# Patient Record
Sex: Female | Born: 1935 | Race: White | Hispanic: No | Smoking: Never smoker
Health system: Southern US, Community
[De-identification: ages and names within clinical notes are randomized; demographics above are authoritative.]

## PROBLEM LIST (undated history)

## (undated) DIAGNOSIS — I480 Paroxysmal atrial fibrillation: Secondary | ICD-10-CM

## (undated) DIAGNOSIS — G8191 Hemiplegia, unspecified affecting right dominant side: Secondary | ICD-10-CM

## (undated) DIAGNOSIS — D509 Iron deficiency anemia, unspecified: Secondary | ICD-10-CM

## (undated) DIAGNOSIS — F329 Major depressive disorder, single episode, unspecified: Secondary | ICD-10-CM

## (undated) DIAGNOSIS — F419 Anxiety disorder, unspecified: Secondary | ICD-10-CM

## (undated) DIAGNOSIS — E785 Hyperlipidemia, unspecified: Secondary | ICD-10-CM

## (undated) DIAGNOSIS — H409 Unspecified glaucoma: Secondary | ICD-10-CM

## (undated) DIAGNOSIS — M129 Arthropathy, unspecified: Secondary | ICD-10-CM

## (undated) DIAGNOSIS — I10 Essential (primary) hypertension: Secondary | ICD-10-CM

## (undated) DIAGNOSIS — I639 Cerebral infarction, unspecified: Secondary | ICD-10-CM

## (undated) DIAGNOSIS — K219 Gastro-esophageal reflux disease without esophagitis: Secondary | ICD-10-CM

## (undated) DIAGNOSIS — R609 Edema, unspecified: Secondary | ICD-10-CM

## (undated) DIAGNOSIS — I872 Venous insufficiency (chronic) (peripheral): Secondary | ICD-10-CM

## (undated) DIAGNOSIS — J3089 Other allergic rhinitis: Secondary | ICD-10-CM

## (undated) DIAGNOSIS — M1990 Unspecified osteoarthritis, unspecified site: Secondary | ICD-10-CM

## (undated) HISTORY — DX: Paroxysmal atrial fibrillation: I48.0

## (undated) HISTORY — DX: Unspecified osteoarthritis, unspecified site: M19.90

## (undated) HISTORY — DX: Gastro-esophageal reflux disease without esophagitis: K21.9

## (undated) HISTORY — DX: Unspecified glaucoma: H40.9

## (undated) HISTORY — DX: Hyperlipidemia, unspecified: E78.5

## (undated) HISTORY — DX: Edema, unspecified: R60.9

## (undated) HISTORY — PX: ABDOMINAL HYSTERECTOMY: SHX81

## (undated) HISTORY — DX: Cerebral infarction, unspecified: I63.9

## (undated) HISTORY — DX: Major depressive disorder, single episode, unspecified: F32.9

## (undated) HISTORY — DX: Arthropathy, unspecified: M12.9

## (undated) HISTORY — DX: Other allergic rhinitis: J30.89

## (undated) HISTORY — DX: Anxiety disorder, unspecified: F41.9

## (undated) HISTORY — DX: Venous insufficiency (chronic) (peripheral): I87.2

## (undated) HISTORY — DX: Essential (primary) hypertension: I10

## (undated) HISTORY — PX: REPLACEMENT TOTAL KNEE: SUR1224

## (undated) HISTORY — DX: Iron deficiency anemia, unspecified: D50.9

---

## 1999-06-11 ENCOUNTER — Ambulatory Visit (HOSPITAL_COMMUNITY): Admission: RE | Admit: 1999-06-11 | Discharge: 1999-06-11 | Payer: Self-pay | Admitting: Obstetrics & Gynecology

## 2008-01-19 ENCOUNTER — Encounter: Payer: Self-pay | Admitting: Internal Medicine

## 2008-01-25 ENCOUNTER — Ambulatory Visit: Payer: Self-pay | Admitting: Physician Assistant

## 2008-02-09 ENCOUNTER — Encounter: Payer: Self-pay | Admitting: Internal Medicine

## 2008-03-14 ENCOUNTER — Encounter: Payer: Self-pay | Admitting: Internal Medicine

## 2008-03-23 ENCOUNTER — Ambulatory Visit: Payer: Self-pay | Admitting: Internal Medicine

## 2008-03-26 ENCOUNTER — Other Ambulatory Visit: Payer: Self-pay

## 2008-03-26 ENCOUNTER — Inpatient Hospital Stay: Payer: Self-pay | Admitting: Internal Medicine

## 2008-04-10 ENCOUNTER — Encounter: Payer: Self-pay | Admitting: Internal Medicine

## 2008-04-20 ENCOUNTER — Ambulatory Visit: Payer: Self-pay | Admitting: Vascular Surgery

## 2008-05-10 ENCOUNTER — Encounter: Payer: Self-pay | Admitting: Internal Medicine

## 2008-06-10 ENCOUNTER — Encounter: Payer: Self-pay | Admitting: Internal Medicine

## 2008-07-11 ENCOUNTER — Encounter: Payer: Self-pay | Admitting: Internal Medicine

## 2008-08-10 ENCOUNTER — Encounter: Payer: Self-pay | Admitting: Internal Medicine

## 2008-09-10 ENCOUNTER — Encounter: Payer: Self-pay | Admitting: Internal Medicine

## 2008-12-13 ENCOUNTER — Encounter: Payer: Self-pay | Admitting: Internal Medicine

## 2009-01-08 ENCOUNTER — Encounter: Payer: Self-pay | Admitting: Internal Medicine

## 2009-03-12 ENCOUNTER — Encounter: Payer: Self-pay | Admitting: Internal Medicine

## 2009-04-10 ENCOUNTER — Encounter: Payer: Self-pay | Admitting: Internal Medicine

## 2009-04-11 ENCOUNTER — Ambulatory Visit: Payer: Self-pay | Admitting: Internal Medicine

## 2009-09-25 ENCOUNTER — Ambulatory Visit: Payer: Self-pay | Admitting: Internal Medicine

## 2010-10-10 ENCOUNTER — Ambulatory Visit: Payer: Self-pay | Admitting: Internal Medicine

## 2010-10-15 ENCOUNTER — Inpatient Hospital Stay: Payer: Self-pay | Admitting: Internal Medicine

## 2010-10-23 ENCOUNTER — Encounter: Payer: Self-pay | Admitting: Internal Medicine

## 2010-10-25 ENCOUNTER — Telehealth: Payer: Self-pay | Admitting: Family Medicine

## 2010-11-10 ENCOUNTER — Ambulatory Visit: Payer: Self-pay | Admitting: Internal Medicine

## 2010-12-02 DIAGNOSIS — F329 Major depressive disorder, single episode, unspecified: Secondary | ICD-10-CM

## 2010-12-02 DIAGNOSIS — I4891 Unspecified atrial fibrillation: Secondary | ICD-10-CM

## 2010-12-02 DIAGNOSIS — I6990 Unspecified sequelae of unspecified cerebrovascular disease: Secondary | ICD-10-CM

## 2010-12-02 DIAGNOSIS — I10 Essential (primary) hypertension: Secondary | ICD-10-CM

## 2010-12-04 DIAGNOSIS — IMO0002 Reserved for concepts with insufficient information to code with codable children: Secondary | ICD-10-CM

## 2010-12-06 DIAGNOSIS — D6489 Other specified anemias: Secondary | ICD-10-CM

## 2010-12-12 NOTE — Progress Notes (Signed)
Summary: needs order to use air pack  Phone Note Call from Patient   Caller: Daughter  Allison Quinn  [phone] Call For: Dr. Alphonsus Sias Summary of Call: Pt was recently admitted to [facility] and will be seeing Dr. Alphonsus Sias there next week.  She has an air pack that she uses at night x 2 years and she will need an order to continue to use it because it is not on her med list there.  Please fax order.   Initial call taken by: Lowella Petties CMA, AAMA,  October 25, 2010 2:28 PM  Follow-up for Phone Call        Do we need more specifics in order to write the order or can it say air pack as needed? Ruthe Mannan MD  October 25, 2010 2:35 PM  Daughter wants order to say ok to use air pack at night while sleeping.              Lowella Petties CMA, AAMA  October 25, 2010 2:39 PM   Additional Follow-up for Phone Call Additional follow up Details #1::        In my box.  Hope thats ok, I have never written an order like that. Ruthe Mannan MD  October 25, 2010 2:40 PM   Order faxed. Additional Follow-up by: Lowella Petties CMA, AAMA,  October 25, 2010 3:08 PM

## 2010-12-12 NOTE — Letter (Signed)
Summary: [facility] Face [facility]  [facility] Face Sheet   Imported By: Beau Fanny 10/30/2010 08:55:12  _____________________________________________________________________  External Attachment:    Type:   Image     Comment:   External Document

## 2010-12-23 DIAGNOSIS — D131 Benign neoplasm of stomach: Secondary | ICD-10-CM

## 2010-12-23 DIAGNOSIS — D649 Anemia, unspecified: Secondary | ICD-10-CM

## 2010-12-24 ENCOUNTER — Encounter: Payer: Self-pay | Admitting: Internal Medicine

## 2011-01-02 DIAGNOSIS — I10 Essential (primary) hypertension: Secondary | ICD-10-CM

## 2011-01-02 DIAGNOSIS — I6990 Unspecified sequelae of unspecified cerebrovascular disease: Secondary | ICD-10-CM

## 2011-01-02 DIAGNOSIS — I4891 Unspecified atrial fibrillation: Secondary | ICD-10-CM

## 2011-01-02 DIAGNOSIS — F329 Major depressive disorder, single episode, unspecified: Secondary | ICD-10-CM

## 2011-01-06 ENCOUNTER — Encounter: Payer: Self-pay | Admitting: Internal Medicine

## 2011-01-06 ENCOUNTER — Ambulatory Visit: Payer: Self-pay | Admitting: Gastroenterology

## 2011-01-13 LAB — PATHOLOGY REPORT

## 2011-01-16 NOTE — Letter (Signed)
Summary: [facility]  [facility]   Imported By: Maryln Gottron 01/08/2011 10:29:03  _____________________________________________________________________  External Attachment:    Type:   Image     Comment:   External Document

## 2011-01-17 DIAGNOSIS — L02419 Cutaneous abscess of limb, unspecified: Secondary | ICD-10-CM

## 2011-01-17 DIAGNOSIS — D500 Iron deficiency anemia secondary to blood loss (chronic): Secondary | ICD-10-CM

## 2011-01-17 DIAGNOSIS — L03119 Cellulitis of unspecified part of limb: Secondary | ICD-10-CM

## 2011-01-17 DIAGNOSIS — I4891 Unspecified atrial fibrillation: Secondary | ICD-10-CM

## 2011-01-17 DIAGNOSIS — R609 Edema, unspecified: Secondary | ICD-10-CM

## 2011-01-21 NOTE — Procedures (Signed)
Summary: Colonoscopy/[facility]  Colonoscopy/[facility]   Imported By: Lanelle Bal 01/13/2011 11:30:45  _____________________________________________________________________  External Attachment:    Type:   Image     Comment:   External Document  Appended Document: Colonoscopy/ Regional circumferential rectal ulcers---?cancer

## 2011-01-21 NOTE — Procedures (Signed)
Summary: Upper GI Endoscopy/[facility]  Upper GI Endoscopy/[facility]   Imported By: Lanelle Bal 01/13/2011 11:28:35  _____________________________________________________________________  External Attachment:    Type:   Image     Comment:   External Document  Appended Document: Upper GI Endoscopy/[facility] no bleeding source

## 2011-02-03 DIAGNOSIS — L989 Disorder of the skin and subcutaneous tissue, unspecified: Secondary | ICD-10-CM

## 2011-02-13 ENCOUNTER — Encounter: Payer: Self-pay | Admitting: Internal Medicine

## 2011-02-23 ENCOUNTER — Telehealth: Payer: Self-pay | Admitting: Family Medicine

## 2011-02-23 NOTE — Telephone Encounter (Signed)
Received call this morning from RN at [facility] about change in status of patient.  Newly noted today swelling of L knee, warm and tight.  Painful with movement.  Pt is bed-bound. H/o DVT but no calf swelling/pain.  No fevers. Did request tramadol last night, but not endorsing acute pain.  Has tramadol and tylenol standing order. Not currently on coumadin, just on ASA. Reviewed meds. ? OA.  Doubt septic joint as no systemic sxs.  No h/o gout.  Recommend ice to knee, elevate leg, update if any fevers or worsening. Will route to Dr. Allen Norris to be aware.

## 2011-02-24 NOTE — Telephone Encounter (Signed)
Better today Reviewed at [facility]

## 2011-02-25 DIAGNOSIS — M7989 Other specified soft tissue disorders: Secondary | ICD-10-CM

## 2011-03-05 DIAGNOSIS — L02818 Cutaneous abscess of other sites: Secondary | ICD-10-CM

## 2011-03-05 DIAGNOSIS — L03818 Cellulitis of other sites: Secondary | ICD-10-CM

## 2011-04-03 DIAGNOSIS — I4891 Unspecified atrial fibrillation: Secondary | ICD-10-CM

## 2011-04-03 DIAGNOSIS — I6990 Unspecified sequelae of unspecified cerebrovascular disease: Secondary | ICD-10-CM

## 2011-04-03 DIAGNOSIS — F329 Major depressive disorder, single episode, unspecified: Secondary | ICD-10-CM

## 2011-04-03 DIAGNOSIS — I10 Essential (primary) hypertension: Secondary | ICD-10-CM

## 2011-04-29 ENCOUNTER — Ambulatory Visit: Payer: Self-pay | Admitting: Gastroenterology

## 2011-05-01 DIAGNOSIS — I10 Essential (primary) hypertension: Secondary | ICD-10-CM

## 2011-05-01 DIAGNOSIS — F329 Major depressive disorder, single episode, unspecified: Secondary | ICD-10-CM

## 2011-05-01 DIAGNOSIS — I6990 Unspecified sequelae of unspecified cerebrovascular disease: Secondary | ICD-10-CM

## 2011-05-01 DIAGNOSIS — I4891 Unspecified atrial fibrillation: Secondary | ICD-10-CM

## 2011-05-01 LAB — PATHOLOGY REPORT

## 2011-06-03 DIAGNOSIS — M7580 Other shoulder lesions, unspecified shoulder: Secondary | ICD-10-CM

## 2011-06-17 DIAGNOSIS — M75100 Unspecified rotator cuff tear or rupture of unspecified shoulder, not specified as traumatic: Secondary | ICD-10-CM

## 2011-07-02 DIAGNOSIS — T7840XA Allergy, unspecified, initial encounter: Secondary | ICD-10-CM

## 2011-07-15 DIAGNOSIS — M25519 Pain in unspecified shoulder: Secondary | ICD-10-CM

## 2011-08-15 DIAGNOSIS — L03039 Cellulitis of unspecified toe: Secondary | ICD-10-CM

## 2011-08-15 DIAGNOSIS — L02619 Cutaneous abscess of unspecified foot: Secondary | ICD-10-CM

## 2011-08-26 DIAGNOSIS — M19219 Secondary osteoarthritis, unspecified shoulder: Secondary | ICD-10-CM

## 2011-09-18 DIAGNOSIS — L570 Actinic keratosis: Secondary | ICD-10-CM

## 2011-10-07 ENCOUNTER — Telehealth: Payer: Self-pay | Admitting: *Deleted

## 2011-10-07 NOTE — Telephone Encounter (Signed)
Prior auth given for voltaren gel, approval letter is on your desk.

## 2011-10-08 NOTE — Telephone Encounter (Signed)
noted 

## 2011-10-30 DIAGNOSIS — I6990 Unspecified sequelae of unspecified cerebrovascular disease: Secondary | ICD-10-CM

## 2011-10-30 DIAGNOSIS — I4891 Unspecified atrial fibrillation: Secondary | ICD-10-CM

## 2011-10-30 DIAGNOSIS — I10 Essential (primary) hypertension: Secondary | ICD-10-CM

## 2011-10-30 DIAGNOSIS — F329 Major depressive disorder, single episode, unspecified: Secondary | ICD-10-CM

## 2011-12-09 DIAGNOSIS — M1710 Unilateral primary osteoarthritis, unspecified knee: Secondary | ICD-10-CM

## 2011-12-30 DIAGNOSIS — F329 Major depressive disorder, single episode, unspecified: Secondary | ICD-10-CM

## 2011-12-30 DIAGNOSIS — I6990 Unspecified sequelae of unspecified cerebrovascular disease: Secondary | ICD-10-CM

## 2011-12-30 DIAGNOSIS — I4891 Unspecified atrial fibrillation: Secondary | ICD-10-CM

## 2011-12-30 DIAGNOSIS — I10 Essential (primary) hypertension: Secondary | ICD-10-CM

## 2012-01-27 DIAGNOSIS — F39 Unspecified mood [affective] disorder: Secondary | ICD-10-CM

## 2012-02-17 DIAGNOSIS — I6990 Unspecified sequelae of unspecified cerebrovascular disease: Secondary | ICD-10-CM

## 2012-02-17 DIAGNOSIS — I4891 Unspecified atrial fibrillation: Secondary | ICD-10-CM

## 2012-02-17 DIAGNOSIS — F329 Major depressive disorder, single episode, unspecified: Secondary | ICD-10-CM

## 2012-02-17 DIAGNOSIS — I872 Venous insufficiency (chronic) (peripheral): Secondary | ICD-10-CM

## 2012-03-23 DIAGNOSIS — C44711 Basal cell carcinoma of skin of unspecified lower limb, including hip: Secondary | ICD-10-CM

## 2012-04-19 DIAGNOSIS — R070 Pain in throat: Secondary | ICD-10-CM

## 2012-05-11 DIAGNOSIS — M1710 Unilateral primary osteoarthritis, unspecified knee: Secondary | ICD-10-CM

## 2012-07-01 DIAGNOSIS — H9209 Otalgia, unspecified ear: Secondary | ICD-10-CM

## 2012-07-06 DIAGNOSIS — R635 Abnormal weight gain: Secondary | ICD-10-CM

## 2012-07-06 DIAGNOSIS — M19019 Primary osteoarthritis, unspecified shoulder: Secondary | ICD-10-CM

## 2012-08-13 DIAGNOSIS — IMO0002 Reserved for concepts with insufficient information to code with codable children: Secondary | ICD-10-CM

## 2012-09-06 DIAGNOSIS — I4891 Unspecified atrial fibrillation: Secondary | ICD-10-CM

## 2012-09-06 DIAGNOSIS — F329 Major depressive disorder, single episode, unspecified: Secondary | ICD-10-CM

## 2012-09-06 DIAGNOSIS — I10 Essential (primary) hypertension: Secondary | ICD-10-CM

## 2012-09-06 DIAGNOSIS — I6990 Unspecified sequelae of unspecified cerebrovascular disease: Secondary | ICD-10-CM

## 2012-09-14 DIAGNOSIS — I872 Venous insufficiency (chronic) (peripheral): Secondary | ICD-10-CM

## 2012-09-14 DIAGNOSIS — F411 Generalized anxiety disorder: Secondary | ICD-10-CM

## 2012-10-11 DIAGNOSIS — IMO0002 Reserved for concepts with insufficient information to code with codable children: Secondary | ICD-10-CM

## 2012-10-19 DIAGNOSIS — IMO0002 Reserved for concepts with insufficient information to code with codable children: Secondary | ICD-10-CM

## 2012-11-23 DIAGNOSIS — M25569 Pain in unspecified knee: Secondary | ICD-10-CM

## 2012-12-06 DIAGNOSIS — F329 Major depressive disorder, single episode, unspecified: Secondary | ICD-10-CM

## 2012-12-06 DIAGNOSIS — F3289 Other specified depressive episodes: Secondary | ICD-10-CM

## 2012-12-13 DIAGNOSIS — I6990 Unspecified sequelae of unspecified cerebrovascular disease: Secondary | ICD-10-CM

## 2012-12-13 DIAGNOSIS — I872 Venous insufficiency (chronic) (peripheral): Secondary | ICD-10-CM

## 2012-12-13 DIAGNOSIS — I10 Essential (primary) hypertension: Secondary | ICD-10-CM

## 2012-12-13 DIAGNOSIS — F329 Major depressive disorder, single episode, unspecified: Secondary | ICD-10-CM

## 2012-12-23 DIAGNOSIS — N6459 Other signs and symptoms in breast: Secondary | ICD-10-CM

## 2012-12-30 DIAGNOSIS — M19019 Primary osteoarthritis, unspecified shoulder: Secondary | ICD-10-CM

## 2013-02-01 DIAGNOSIS — J069 Acute upper respiratory infection, unspecified: Secondary | ICD-10-CM

## 2013-03-03 DIAGNOSIS — F329 Major depressive disorder, single episode, unspecified: Secondary | ICD-10-CM

## 2013-03-03 DIAGNOSIS — I10 Essential (primary) hypertension: Secondary | ICD-10-CM

## 2013-03-03 DIAGNOSIS — I6990 Unspecified sequelae of unspecified cerebrovascular disease: Secondary | ICD-10-CM

## 2013-03-03 DIAGNOSIS — I87019 Postthrombotic syndrome with ulcer of unspecified lower extremity: Secondary | ICD-10-CM

## 2013-03-04 DIAGNOSIS — L989 Disorder of the skin and subcutaneous tissue, unspecified: Secondary | ICD-10-CM

## 2013-04-15 DIAGNOSIS — B351 Tinea unguium: Secondary | ICD-10-CM

## 2013-05-05 DIAGNOSIS — L570 Actinic keratosis: Secondary | ICD-10-CM

## 2013-07-04 DIAGNOSIS — F329 Major depressive disorder, single episode, unspecified: Secondary | ICD-10-CM

## 2013-07-04 DIAGNOSIS — I872 Venous insufficiency (chronic) (peripheral): Secondary | ICD-10-CM

## 2013-07-04 DIAGNOSIS — I6990 Unspecified sequelae of unspecified cerebrovascular disease: Secondary | ICD-10-CM

## 2013-07-04 DIAGNOSIS — I10 Essential (primary) hypertension: Secondary | ICD-10-CM

## 2013-07-26 DIAGNOSIS — M159 Polyosteoarthritis, unspecified: Secondary | ICD-10-CM

## 2013-08-24 DIAGNOSIS — F411 Generalized anxiety disorder: Secondary | ICD-10-CM

## 2013-08-24 DIAGNOSIS — M1710 Unilateral primary osteoarthritis, unspecified knee: Secondary | ICD-10-CM

## 2013-08-24 DIAGNOSIS — I10 Essential (primary) hypertension: Secondary | ICD-10-CM

## 2013-08-24 DIAGNOSIS — I69998 Other sequelae following unspecified cerebrovascular disease: Secondary | ICD-10-CM

## 2013-08-24 DIAGNOSIS — F329 Major depressive disorder, single episode, unspecified: Secondary | ICD-10-CM

## 2013-08-24 DIAGNOSIS — I872 Venous insufficiency (chronic) (peripheral): Secondary | ICD-10-CM

## 2013-10-11 DIAGNOSIS — J209 Acute bronchitis, unspecified: Secondary | ICD-10-CM

## 2014-02-14 DIAGNOSIS — F3289 Other specified depressive episodes: Secondary | ICD-10-CM

## 2014-02-14 DIAGNOSIS — M1710 Unilateral primary osteoarthritis, unspecified knee: Secondary | ICD-10-CM

## 2014-02-14 DIAGNOSIS — IMO0002 Reserved for concepts with insufficient information to code with codable children: Secondary | ICD-10-CM

## 2014-02-14 DIAGNOSIS — F329 Major depressive disorder, single episode, unspecified: Secondary | ICD-10-CM

## 2014-02-14 DIAGNOSIS — I10 Essential (primary) hypertension: Secondary | ICD-10-CM

## 2014-02-14 DIAGNOSIS — J209 Acute bronchitis, unspecified: Secondary | ICD-10-CM

## 2014-02-14 DIAGNOSIS — I6350 Cerebral infarction due to unspecified occlusion or stenosis of unspecified cerebral artery: Secondary | ICD-10-CM

## 2014-02-16 DIAGNOSIS — S90129A Contusion of unspecified lesser toe(s) without damage to nail, initial encounter: Secondary | ICD-10-CM

## 2014-02-28 DIAGNOSIS — J309 Allergic rhinitis, unspecified: Secondary | ICD-10-CM

## 2014-02-28 DIAGNOSIS — J019 Acute sinusitis, unspecified: Secondary | ICD-10-CM

## 2014-04-04 DIAGNOSIS — M79609 Pain in unspecified limb: Secondary | ICD-10-CM

## 2014-04-19 DIAGNOSIS — F329 Major depressive disorder, single episode, unspecified: Secondary | ICD-10-CM

## 2014-04-19 DIAGNOSIS — I6350 Cerebral infarction due to unspecified occlusion or stenosis of unspecified cerebral artery: Secondary | ICD-10-CM

## 2014-04-19 DIAGNOSIS — IMO0002 Reserved for concepts with insufficient information to code with codable children: Secondary | ICD-10-CM

## 2014-04-19 DIAGNOSIS — I872 Venous insufficiency (chronic) (peripheral): Secondary | ICD-10-CM

## 2014-04-19 DIAGNOSIS — F3289 Other specified depressive episodes: Secondary | ICD-10-CM

## 2014-04-19 DIAGNOSIS — I10 Essential (primary) hypertension: Secondary | ICD-10-CM

## 2014-04-19 DIAGNOSIS — M1710 Unilateral primary osteoarthritis, unspecified knee: Secondary | ICD-10-CM

## 2014-04-24 DIAGNOSIS — B308 Other viral conjunctivitis: Secondary | ICD-10-CM

## 2014-06-29 DIAGNOSIS — I10 Essential (primary) hypertension: Secondary | ICD-10-CM

## 2014-06-29 DIAGNOSIS — F329 Major depressive disorder, single episode, unspecified: Secondary | ICD-10-CM

## 2014-06-29 DIAGNOSIS — I4891 Unspecified atrial fibrillation: Secondary | ICD-10-CM

## 2014-06-29 DIAGNOSIS — I872 Venous insufficiency (chronic) (peripheral): Secondary | ICD-10-CM

## 2014-06-29 DIAGNOSIS — I69959 Hemiplegia and hemiparesis following unspecified cerebrovascular disease affecting unspecified side: Secondary | ICD-10-CM

## 2014-06-29 DIAGNOSIS — F3289 Other specified depressive episodes: Secondary | ICD-10-CM

## 2014-07-20 DIAGNOSIS — J309 Allergic rhinitis, unspecified: Secondary | ICD-10-CM

## 2014-08-16 DIAGNOSIS — I4891 Unspecified atrial fibrillation: Secondary | ICD-10-CM

## 2014-08-16 DIAGNOSIS — K219 Gastro-esophageal reflux disease without esophagitis: Secondary | ICD-10-CM

## 2014-08-16 DIAGNOSIS — I872 Venous insufficiency (chronic) (peripheral): Secondary | ICD-10-CM

## 2014-08-16 DIAGNOSIS — F323 Major depressive disorder, single episode, severe with psychotic features: Secondary | ICD-10-CM

## 2014-08-16 DIAGNOSIS — I69398 Other sequelae of cerebral infarction: Secondary | ICD-10-CM

## 2014-08-16 DIAGNOSIS — J301 Allergic rhinitis due to pollen: Secondary | ICD-10-CM

## 2014-08-16 DIAGNOSIS — I10 Essential (primary) hypertension: Secondary | ICD-10-CM

## 2014-10-19 DIAGNOSIS — I10 Essential (primary) hypertension: Secondary | ICD-10-CM

## 2014-10-19 DIAGNOSIS — K219 Gastro-esophageal reflux disease without esophagitis: Secondary | ICD-10-CM

## 2014-10-19 DIAGNOSIS — G8191 Hemiplegia, unspecified affecting right dominant side: Secondary | ICD-10-CM

## 2014-10-19 DIAGNOSIS — I872 Venous insufficiency (chronic) (peripheral): Secondary | ICD-10-CM

## 2014-10-19 DIAGNOSIS — I480 Paroxysmal atrial fibrillation: Secondary | ICD-10-CM

## 2014-10-19 DIAGNOSIS — F39 Unspecified mood [affective] disorder: Secondary | ICD-10-CM

## 2014-11-07 DIAGNOSIS — J069 Acute upper respiratory infection, unspecified: Secondary | ICD-10-CM

## 2014-12-14 DIAGNOSIS — I480 Paroxysmal atrial fibrillation: Secondary | ICD-10-CM

## 2014-12-14 DIAGNOSIS — K219 Gastro-esophageal reflux disease without esophagitis: Secondary | ICD-10-CM

## 2014-12-14 DIAGNOSIS — F39 Unspecified mood [affective] disorder: Secondary | ICD-10-CM

## 2014-12-14 DIAGNOSIS — G8191 Hemiplegia, unspecified affecting right dominant side: Secondary | ICD-10-CM

## 2014-12-14 DIAGNOSIS — I872 Venous insufficiency (chronic) (peripheral): Secondary | ICD-10-CM

## 2014-12-14 DIAGNOSIS — I10 Essential (primary) hypertension: Secondary | ICD-10-CM

## 2015-01-05 DIAGNOSIS — R221 Localized swelling, mass and lump, neck: Secondary | ICD-10-CM | POA: Diagnosis not present

## 2015-02-21 DIAGNOSIS — I10 Essential (primary) hypertension: Secondary | ICD-10-CM | POA: Diagnosis not present

## 2015-02-21 DIAGNOSIS — F323 Major depressive disorder, single episode, severe with psychotic features: Secondary | ICD-10-CM

## 2015-02-21 DIAGNOSIS — I69398 Other sequelae of cerebral infarction: Secondary | ICD-10-CM | POA: Diagnosis not present

## 2015-02-21 DIAGNOSIS — J301 Allergic rhinitis due to pollen: Secondary | ICD-10-CM

## 2015-02-21 DIAGNOSIS — K219 Gastro-esophageal reflux disease without esophagitis: Secondary | ICD-10-CM | POA: Diagnosis not present

## 2015-02-21 DIAGNOSIS — I872 Venous insufficiency (chronic) (peripheral): Secondary | ICD-10-CM

## 2015-02-21 DIAGNOSIS — I4891 Unspecified atrial fibrillation: Secondary | ICD-10-CM | POA: Diagnosis not present

## 2015-04-19 DIAGNOSIS — I480 Paroxysmal atrial fibrillation: Secondary | ICD-10-CM | POA: Diagnosis not present

## 2015-04-19 DIAGNOSIS — L570 Actinic keratosis: Secondary | ICD-10-CM | POA: Diagnosis not present

## 2015-04-19 DIAGNOSIS — K219 Gastro-esophageal reflux disease without esophagitis: Secondary | ICD-10-CM | POA: Diagnosis not present

## 2015-04-19 DIAGNOSIS — I872 Venous insufficiency (chronic) (peripheral): Secondary | ICD-10-CM | POA: Diagnosis not present

## 2015-05-25 DIAGNOSIS — B351 Tinea unguium: Secondary | ICD-10-CM | POA: Diagnosis not present

## 2015-06-22 DIAGNOSIS — M1990 Unspecified osteoarthritis, unspecified site: Secondary | ICD-10-CM

## 2015-06-22 DIAGNOSIS — F39 Unspecified mood [affective] disorder: Secondary | ICD-10-CM

## 2015-06-22 DIAGNOSIS — I872 Venous insufficiency (chronic) (peripheral): Secondary | ICD-10-CM

## 2015-06-22 DIAGNOSIS — I69398 Other sequelae of cerebral infarction: Secondary | ICD-10-CM | POA: Diagnosis not present

## 2015-06-22 DIAGNOSIS — I4891 Unspecified atrial fibrillation: Secondary | ICD-10-CM | POA: Diagnosis not present

## 2015-06-22 DIAGNOSIS — I10 Essential (primary) hypertension: Secondary | ICD-10-CM | POA: Diagnosis not present

## 2015-06-22 DIAGNOSIS — K219 Gastro-esophageal reflux disease without esophagitis: Secondary | ICD-10-CM | POA: Diagnosis not present

## 2015-10-24 DIAGNOSIS — M1990 Unspecified osteoarthritis, unspecified site: Secondary | ICD-10-CM | POA: Diagnosis not present

## 2015-10-24 DIAGNOSIS — K219 Gastro-esophageal reflux disease without esophagitis: Secondary | ICD-10-CM | POA: Diagnosis not present

## 2015-10-24 DIAGNOSIS — F39 Unspecified mood [affective] disorder: Secondary | ICD-10-CM

## 2015-10-24 DIAGNOSIS — I10 Essential (primary) hypertension: Secondary | ICD-10-CM | POA: Diagnosis not present

## 2015-10-24 DIAGNOSIS — I872 Venous insufficiency (chronic) (peripheral): Secondary | ICD-10-CM

## 2015-10-24 DIAGNOSIS — I69398 Other sequelae of cerebral infarction: Secondary | ICD-10-CM

## 2015-10-24 DIAGNOSIS — I4891 Unspecified atrial fibrillation: Secondary | ICD-10-CM | POA: Diagnosis not present

## 2015-11-23 DIAGNOSIS — L989 Disorder of the skin and subcutaneous tissue, unspecified: Secondary | ICD-10-CM | POA: Diagnosis not present

## 2015-12-20 DIAGNOSIS — I480 Paroxysmal atrial fibrillation: Secondary | ICD-10-CM | POA: Diagnosis not present

## 2015-12-20 DIAGNOSIS — I872 Venous insufficiency (chronic) (peripheral): Secondary | ICD-10-CM | POA: Diagnosis not present

## 2015-12-20 DIAGNOSIS — K219 Gastro-esophageal reflux disease without esophagitis: Secondary | ICD-10-CM | POA: Diagnosis not present

## 2015-12-20 DIAGNOSIS — F39 Unspecified mood [affective] disorder: Secondary | ICD-10-CM

## 2015-12-20 DIAGNOSIS — G8191 Hemiplegia, unspecified affecting right dominant side: Secondary | ICD-10-CM | POA: Diagnosis not present

## 2016-02-20 DIAGNOSIS — M1990 Unspecified osteoarthritis, unspecified site: Secondary | ICD-10-CM

## 2016-02-20 DIAGNOSIS — I10 Essential (primary) hypertension: Secondary | ICD-10-CM | POA: Diagnosis not present

## 2016-02-20 DIAGNOSIS — I4891 Unspecified atrial fibrillation: Secondary | ICD-10-CM | POA: Diagnosis not present

## 2016-02-20 DIAGNOSIS — I69398 Other sequelae of cerebral infarction: Secondary | ICD-10-CM | POA: Diagnosis not present

## 2016-02-20 DIAGNOSIS — K219 Gastro-esophageal reflux disease without esophagitis: Secondary | ICD-10-CM | POA: Diagnosis not present

## 2016-03-03 DIAGNOSIS — R110 Nausea: Secondary | ICD-10-CM | POA: Diagnosis not present

## 2016-03-12 DIAGNOSIS — R05 Cough: Secondary | ICD-10-CM | POA: Diagnosis not present

## 2016-03-12 DIAGNOSIS — R4182 Altered mental status, unspecified: Secondary | ICD-10-CM | POA: Diagnosis not present

## 2016-03-13 DIAGNOSIS — J181 Lobar pneumonia, unspecified organism: Secondary | ICD-10-CM | POA: Diagnosis not present

## 2016-04-24 DIAGNOSIS — I480 Paroxysmal atrial fibrillation: Secondary | ICD-10-CM | POA: Diagnosis not present

## 2016-04-24 DIAGNOSIS — K219 Gastro-esophageal reflux disease without esophagitis: Secondary | ICD-10-CM | POA: Diagnosis not present

## 2016-04-24 DIAGNOSIS — F39 Unspecified mood [affective] disorder: Secondary | ICD-10-CM

## 2016-04-24 DIAGNOSIS — G8191 Hemiplegia, unspecified affecting right dominant side: Secondary | ICD-10-CM | POA: Diagnosis not present

## 2016-04-24 DIAGNOSIS — I872 Venous insufficiency (chronic) (peripheral): Secondary | ICD-10-CM | POA: Diagnosis not present

## 2016-05-21 DIAGNOSIS — B351 Tinea unguium: Secondary | ICD-10-CM

## 2016-06-18 DIAGNOSIS — F39 Unspecified mood [affective] disorder: Secondary | ICD-10-CM

## 2016-06-18 DIAGNOSIS — K219 Gastro-esophageal reflux disease without esophagitis: Secondary | ICD-10-CM | POA: Diagnosis not present

## 2016-06-18 DIAGNOSIS — M1990 Unspecified osteoarthritis, unspecified site: Secondary | ICD-10-CM

## 2016-06-18 DIAGNOSIS — I4891 Unspecified atrial fibrillation: Secondary | ICD-10-CM | POA: Diagnosis not present

## 2016-06-18 DIAGNOSIS — I82210 Acute embolism and thrombosis of superior vena cava: Secondary | ICD-10-CM | POA: Diagnosis not present

## 2016-06-18 DIAGNOSIS — I69398 Other sequelae of cerebral infarction: Secondary | ICD-10-CM | POA: Diagnosis not present

## 2016-06-18 DIAGNOSIS — I10 Essential (primary) hypertension: Secondary | ICD-10-CM

## 2016-08-21 DIAGNOSIS — K219 Gastro-esophageal reflux disease without esophagitis: Secondary | ICD-10-CM

## 2016-08-21 DIAGNOSIS — I10 Essential (primary) hypertension: Secondary | ICD-10-CM | POA: Diagnosis not present

## 2016-08-21 DIAGNOSIS — I872 Venous insufficiency (chronic) (peripheral): Secondary | ICD-10-CM | POA: Diagnosis not present

## 2016-08-21 DIAGNOSIS — I480 Paroxysmal atrial fibrillation: Secondary | ICD-10-CM | POA: Diagnosis not present

## 2016-08-21 DIAGNOSIS — F39 Unspecified mood [affective] disorder: Secondary | ICD-10-CM

## 2016-08-21 DIAGNOSIS — G8191 Hemiplegia, unspecified affecting right dominant side: Secondary | ICD-10-CM | POA: Diagnosis not present

## 2016-09-03 DIAGNOSIS — B351 Tinea unguium: Secondary | ICD-10-CM

## 2016-10-13 DIAGNOSIS — L03039 Cellulitis of unspecified toe: Secondary | ICD-10-CM

## 2016-10-24 DIAGNOSIS — I69398 Other sequelae of cerebral infarction: Secondary | ICD-10-CM | POA: Diagnosis not present

## 2016-10-24 DIAGNOSIS — K219 Gastro-esophageal reflux disease without esophagitis: Secondary | ICD-10-CM

## 2016-10-24 DIAGNOSIS — I4891 Unspecified atrial fibrillation: Secondary | ICD-10-CM | POA: Diagnosis not present

## 2016-10-24 DIAGNOSIS — R062 Wheezing: Secondary | ICD-10-CM

## 2016-10-24 DIAGNOSIS — I872 Venous insufficiency (chronic) (peripheral): Secondary | ICD-10-CM | POA: Diagnosis not present

## 2016-10-24 DIAGNOSIS — I10 Essential (primary) hypertension: Secondary | ICD-10-CM

## 2016-10-24 DIAGNOSIS — F0631 Mood disorder due to known physiological condition with depressive features: Secondary | ICD-10-CM

## 2016-10-24 DIAGNOSIS — M1990 Unspecified osteoarthritis, unspecified site: Secondary | ICD-10-CM | POA: Diagnosis not present

## 2016-12-08 ENCOUNTER — Telehealth: Payer: Self-pay | Admitting: Internal Medicine

## 2016-12-08 DIAGNOSIS — H10023 Other mucopurulent conjunctivitis, bilateral: Secondary | ICD-10-CM | POA: Diagnosis not present

## 2016-12-08 NOTE — Telephone Encounter (Signed)
error 

## 2017-01-01 DIAGNOSIS — I480 Paroxysmal atrial fibrillation: Secondary | ICD-10-CM | POA: Diagnosis not present

## 2017-01-01 DIAGNOSIS — F39 Unspecified mood [affective] disorder: Secondary | ICD-10-CM

## 2017-01-01 DIAGNOSIS — K219 Gastro-esophageal reflux disease without esophagitis: Secondary | ICD-10-CM | POA: Diagnosis not present

## 2017-01-01 DIAGNOSIS — I10 Essential (primary) hypertension: Secondary | ICD-10-CM | POA: Diagnosis not present

## 2017-01-01 DIAGNOSIS — G8191 Hemiplegia, unspecified affecting right dominant side: Secondary | ICD-10-CM | POA: Diagnosis not present

## 2017-02-27 DIAGNOSIS — I4891 Unspecified atrial fibrillation: Secondary | ICD-10-CM | POA: Diagnosis not present

## 2017-02-27 DIAGNOSIS — I69398 Other sequelae of cerebral infarction: Secondary | ICD-10-CM | POA: Diagnosis not present

## 2017-03-04 DIAGNOSIS — B351 Tinea unguium: Secondary | ICD-10-CM

## 2017-03-09 DIAGNOSIS — M1990 Unspecified osteoarthritis, unspecified site: Secondary | ICD-10-CM

## 2017-03-09 DIAGNOSIS — M25522 Pain in left elbow: Secondary | ICD-10-CM

## 2017-03-10 DIAGNOSIS — M25512 Pain in left shoulder: Secondary | ICD-10-CM

## 2017-03-12 DIAGNOSIS — C7951 Secondary malignant neoplasm of bone: Secondary | ICD-10-CM

## 2017-03-22 DIAGNOSIS — C7951 Secondary malignant neoplasm of bone: Secondary | ICD-10-CM | POA: Insufficient documentation

## 2017-03-22 NOTE — Progress Notes (Addendum)
[facility]  [phone] [phone]  ID: Allison Quinn OB: 1926-03-31  MR#: [id number]  BPZ#:[id number]  Patient Care Team: Venia Carbon, MD as PCP - General  CHIEF COMPLAINT: Bone metastasis  INTERVAL HISTORY: Patient is an 83 year old female who is a resident of nursing home who presented with a fractured left humerus. Subsequent workup indicated fracture was likely pathologic. Patient is hard of hearing and demented and much the history is given by her son. He does not report any falls. She has no new neurologic complaints. She has no fevers or recent illnesses. She has not complained of chest pain or shortness of breath. She has a fair appetite and there is no report of weight loss. She has no nausea, vomiting, constipation, or diarrhea. She has no urinary complaints. Patient appears to be at her baseline and offers no specific complaints today.  REVIEW OF SYSTEMS:   Review of Systems  Unable to perform ROS: Dementia    As per HPI. Otherwise, a complete review of systems is negative.  PAST MEDICAL HISTORY: Past Medical History:  Diagnosis Date  . Anxiety disorder, unspecified   . Arthritis   . Arthropathy   . Edema   . Essential hypertension   . GERD (gastroesophageal reflux disease)   . Glaucoma   . Hyperlipidemia   . IDA (iron deficiency anemia)   . Major depressive disorder   . Other allergic rhinitis   . Paroxysmal atrial fibrillation (HCC)   . Stroke ([place])   . Venous insufficiency (chronic) (peripheral)     PAST SURGICAL HISTORY: Past Surgical History:  Procedure Laterality Date  . ABDOMINAL HYSTERECTOMY      FAMILY HISTORY: No family history on file.  ADVANCED DIRECTIVES (Y/N):  N  HEALTH MAINTENANCE: Social History  Substance Use Topics  . Smoking status: Not on file  . Smokeless tobacco: Not on file  . Alcohol use Not on file     Colonoscopy:  PAP:  Bone density:  Lipid panel:  Allergies    Allergen Reactions  . Codeine   . Lincocin [Lincomycin Hcl]   . Penicillins   . Tetracyclines & Related     Current Outpatient Prescriptions  Medication Sig Dispense Refill  . acetaminophen (TYLENOL) 325 MG tablet Take 650 mg by mouth 4 (four) times daily as needed.    Marland Kitchen alum & mag hydroxide-simeth (MAALOX/MYLANTA) 200-200-20 MG/5ML suspension Take 30 mLs by mouth every 4 (four) hours as needed for indigestion or heartburn.    Marland Kitchen aspirin 81 MG chewable tablet Chew 81 mg by mouth daily.    . carbamide peroxide (DEBROX) 6.5 % otic solution Place 5 drops into both ears as needed.    . carvedilol (COREG) 3.125 MG tablet Take 3.125 mg by mouth 2 (two) times daily with a meal.    . clindamycin (CLEOCIN) 150 MG capsule Take by mouth once. Take 4 capsules PO 1 hour prior to dental procedure    . diclofenac sodium (VOLTAREN) 1 % GEL Apply 2 g topically 2 (two) times daily.    . Emollient (EUCERIN) lotion Apply topically as needed for dry skin.    Marland Kitchen ergocalciferol (VITAMIN D2) 50000 units capsule Take 50,000 Units by mouth every 30 (thirty) days.    . fexofenadine (ALLEGRA) 180 MG tablet Take 180 mg by mouth daily as needed for allergies or rhinitis.    Marland Kitchen FLUoxetine (PROZAC) 20 MG capsule Take 20 mg by mouth daily.    . fluticasone (FLONASE) 50  MCG/ACT nasal spray Place 2 sprays into both nostrils daily.    . furosemide (LASIX) 80 MG tablet Take 80 mg by mouth as needed. If weight is greater than 200, give at 1pm    . furosemide (LASIX) 80 MG tablet Take 80 mg by mouth daily.    Marland Kitchen guaifenesin (ROBITUSSIN) 100 MG/5ML syrup Take 200 mg by mouth every 4 (four) hours as needed for cough.    Marland Kitchen HYDROcodone-acetaminophen (NORCO/VICODIN) 5-325 MG tablet Take 1 tablet by mouth 2 (two) times daily.    . hydroxypropyl methylcellulose / hypromellose (ISOPTO TEARS / GONIOVISC) 2.5 % ophthalmic solution Place 1 drop into both eyes at bedtime.    Marland Kitchen ipratropium-albuterol (DUONEB) 0.5-2.5 (3) MG/3ML SOLN Take 3 mLs  by nebulization every 4 (four) hours as needed.    . loratadine (CLARITIN) 10 MG tablet Take 10 mg by mouth at bedtime as needed for allergies.    Marland Kitchen loratadine (CLARITIN) 10 MG tablet Take 10 mg by mouth daily.    Marland Kitchen LORazepam (ATIVAN) 0.5 MG tablet Take 0.25 mg by mouth 2 (two) times daily.    Marland Kitchen losartan (COZAAR) 25 MG tablet Take 25 mg by mouth daily.    . montelukast (SINGULAIR) 10 MG tablet Take 10 mg by mouth at bedtime.    . Multiple Vitamin (MULTIVITAMIN) tablet Take 1 tablet by mouth daily.    . pantoprazole (PROTONIX) 40 MG tablet Take 40 mg by mouth daily.    . sennosides-docusate sodium (SENOKOT-S) 8.6-50 MG tablet Take 1 tablet by mouth daily.    . simvastatin (ZOCOR) 40 MG tablet Take 40 mg by mouth daily at 6 PM.    . traMADol (ULTRAM) 50 MG tablet Take by mouth 2 (two) times daily as needed.     No current facility-administered medications for this visit.     OBJECTIVE: Vitals:   03/24/17 0906  BP: 110/75  Pulse: 83  Resp: 18  Temp: 98.1 F (36.7 C)     There is no height or weight on file to calculate BMI.    ECOG FS:2 - Symptomatic, <50% confined to bed  General: Well-developed, well-nourished, no acute distress. Eyes: Pink conjunctiva, anicteric sclera. HEENT: Normocephalic, moist mucous membranes, clear oropharnyx. Lungs: Clear to auscultation bilaterally. Heart: Regular rate and rhythm. No rubs, murmurs, or gallops. Abdomen: Soft, nontender, nondistended. No organomegaly noted, normoactive bowel sounds. Musculoskeletal: Left arm and temporary cast. Neuro: Confused. Cranial nerves grossly intact. Significant weakness on right side secondary to previous CVA. Skin: No rashes or petechiae noted. Psych: Confused. Lymphatics: No cervical, calvicular, axillary or inguinal LAD.   LAB RESULTS:  Lab Results  Component Value Date   NA 136 03/24/2017   K 4.0 03/24/2017   CL 99 (L) 03/24/2017   CO2 29 03/24/2017   GLUCOSE 126 (H) 03/24/2017   BUN 40 (H) 03/24/2017    CREATININE 1.37 (H) 03/24/2017   CALCIUM 9.6 03/24/2017   PROT 7.2 03/24/2017   ALBUMIN 3.2 (L) 03/24/2017   AST 25 03/24/2017   ALT 10 (L) 03/24/2017   ALKPHOS 287 (H) 03/24/2017   BILITOT 0.6 03/24/2017   GFRNONAA 33 (L) 03/24/2017   GFRAA 38 (L) 03/24/2017    Lab Results  Component Value Date   WBC 9.7 03/24/2017   NEUTROABS 6.5 03/24/2017   HGB 13.1 03/24/2017   HCT 38.5 03/24/2017   MCV 92.1 03/24/2017   PLT 259 03/24/2017   Lab Results  Component Value Date   LABCA2 206.7 (H) 03/24/2017  Lab Results  Component Value Date   CA125 186.6 (H) 03/24/2017   Lab Results  Component Value Date   CEA 109.3 (H) 03/24/2017     STUDIES: Ct Chest W Contrast  Result Date: 03/24/2017 CLINICAL DATA:  Metastatic disease.  Staging. EXAM: CT CHEST, ABDOMEN, AND PELVIS WITH CONTRAST TECHNIQUE: Multidetector CT imaging of the chest, abdomen and pelvis was performed following the standard protocol during bolus administration of intravenous contrast. CONTRAST:  10mL ISOVUE-300 IOPAMIDOL (ISOVUE-300) INJECTION 61% COMPARISON:  None FINDINGS: CT CHEST FINDINGS Cardiovascular: Moderate cardiac enlargement. Aortic atherosclerosis noted. Filling defect identified within the central left lower lobe pulmonary artery, image 27 of series 2 and right upper lobe pulmonary artery, image 68 of series 6. Mediastinum/Nodes: Moderate to large hiatal hernia. The trachea appears patent and midline. No enlarged mediastinal or hilar adenopathy. No axillary or supraclavicular adenopathy. Lungs/Pleura: No pleural effusion. No suspicious pulmonary nodules or masses. Musculoskeletal: Mass in the central right breast measures 2.3 cm, image 28 of series 2. No enlarged right axillary or supraclavicular lymph nodes. Diffuse mixed lytic and sclerotic bone metastases are identified throughout the visualized osseous structures compatible with widespread bone metastases. Compression fracture of T9 is identified which may be  pathologic. Pathologic fracture involving the left humerus is noted with underlying of permitted bone lesion. Extensive permeative changes involving the proximal right humerus also noted which may be of orthopedic significance. CT ABDOMEN PELVIS FINDINGS Hepatobiliary: No suspicious liver abnormality. Stone within the gallbladder measures 1.7 cm. Pancreas: Diffuse atrophy of the pancreas.  No mass identified. Spleen: The spleen is unremarkable. Adrenals/Urinary Tract: Normal adrenal glands. Left renal atrophy. Multiple areas of cortical scarring are noted involving both kidneys. Small bilateral renal cysts are less than 1 cm in too small to reliably characterize. Stone within the upper pole of the left kidney measures 4 mm. No mass or hydronephrosis. The urinary bladder appears normal. Stomach/Bowel: Large hiatal hernia. The small bowel loops have a normal course and caliber. The appendix is upper limits of normal in caliber measuring 8 mm without surrounding inflammatory change. There is no pathologic dilatation of the colon. Vascular/Lymphatic: Aortic atherosclerosis. No aneurysm. No upper abdominal adenopathy. No pelvic or inguinal adenopathy identified. Reproductive: Previous hysterectomy.  No adnexal mass. Other: There is a large periumbilical hernia which contains fat only, image 79 of series 2. Musculoskeletal: Extensive mixed lytic and sclerotic bone lesions are identified. IMPRESSION: 1. Widespread osseous metastatic disease with a mixed lytic and sclerotic pattern. Pathologic fractures involving left humerus and T9 vertebra noted. 2. There is a solid, enhancing lesion within the right breast which is suspicious and may reflect primary breast carcinoma. 3. Left lower lobe and right upper lobe pulmonary artery filling defects compatible with pulmonary embolus. 4. Aortic Atherosclerosis (ICD10-I70.0), Gallstone, left renal calculus, hiatal hernia, and ventral abdominal wall hernia. Critical Value/emergent  results were called by telephone at the time of interpretation on 03/24/2017 at 1:32 pm to Dr. Delight Hoh , who verbally acknowledged these results. Electronically Signed   By: Kerby Moors M.D.   On: 03/24/2017 13:32   Ct Abdomen Pelvis W Contrast  Result Date: 03/24/2017 CLINICAL DATA:  Metastatic disease.  Staging. EXAM: CT CHEST, ABDOMEN, AND PELVIS WITH CONTRAST TECHNIQUE: Multidetector CT imaging of the chest, abdomen and pelvis was performed following the standard protocol during bolus administration of intravenous contrast. CONTRAST:  55mL ISOVUE-300 IOPAMIDOL (ISOVUE-300) INJECTION 61% COMPARISON:  None FINDINGS: CT CHEST FINDINGS Cardiovascular: Moderate cardiac enlargement. Aortic atherosclerosis noted. Filling defect identified  within the central left lower lobe pulmonary artery, image 27 of series 2 and right upper lobe pulmonary artery, image 68 of series 6. Mediastinum/Nodes: Moderate to large hiatal hernia. The trachea appears patent and midline. No enlarged mediastinal or hilar adenopathy. No axillary or supraclavicular adenopathy. Lungs/Pleura: No pleural effusion. No suspicious pulmonary nodules or masses. Musculoskeletal: Mass in the central right breast measures 2.3 cm, image 28 of series 2. No enlarged right axillary or supraclavicular lymph nodes. Diffuse mixed lytic and sclerotic bone metastases are identified throughout the visualized osseous structures compatible with widespread bone metastases. Compression fracture of T9 is identified which may be pathologic. Pathologic fracture involving the left humerus is noted with underlying of permitted bone lesion. Extensive permeative changes involving the proximal right humerus also noted which may be of orthopedic significance. CT ABDOMEN PELVIS FINDINGS Hepatobiliary: No suspicious liver abnormality. Stone within the gallbladder measures 1.7 cm. Pancreas: Diffuse atrophy of the pancreas.  No mass identified. Spleen: The spleen is  unremarkable. Adrenals/Urinary Tract: Normal adrenal glands. Left renal atrophy. Multiple areas of cortical scarring are noted involving both kidneys. Small bilateral renal cysts are less than 1 cm in too small to reliably characterize. Stone within the upper pole of the left kidney measures 4 mm. No mass or hydronephrosis. The urinary bladder appears normal. Stomach/Bowel: Large hiatal hernia. The small bowel loops have a normal course and caliber. The appendix is upper limits of normal in caliber measuring 8 mm without surrounding inflammatory change. There is no pathologic dilatation of the colon. Vascular/Lymphatic: Aortic atherosclerosis. No aneurysm. No upper abdominal adenopathy. No pelvic or inguinal adenopathy identified. Reproductive: Previous hysterectomy.  No adnexal mass. Other: There is a large periumbilical hernia which contains fat only, image 79 of series 2. Musculoskeletal: Extensive mixed lytic and sclerotic bone lesions are identified. IMPRESSION: 1. Widespread osseous metastatic disease with a mixed lytic and sclerotic pattern. Pathologic fractures involving left humerus and T9 vertebra noted. 2. There is a solid, enhancing lesion within the right breast which is suspicious and may reflect primary breast carcinoma. 3. Left lower lobe and right upper lobe pulmonary artery filling defects compatible with pulmonary embolus. 4. Aortic Atherosclerosis (ICD10-I70.0), Gallstone, left renal calculus, hiatal hernia, and ventral abdominal wall hernia. Critical Value/emergent results were called by telephone at the time of interpretation on 03/24/2017 at 1:32 pm to Dr. Delight Hoh , who verbally acknowledged these results. Electronically Signed   By: Kerby Moors M.D.   On: 03/24/2017 13:32    ASSESSMENT: Bone metastasis  PLAN:    1. Bone metastasis: CT scan results reviewed independently and reported as above with widespread osseous metastasis. Pathologic fracture noted in left humerus as  well as T9 vertebrae. Given the breast mass seen on CT scan as well as her elevated CA-27-29 is highly suspicious for metastatic breast cancer. Plan is to have surgical repair of her left humerus at which time a biopsy can be taken of her pathologic fracture to confirm the diagnosis. Patient is not a candidate for chemotherapy, but if proven to be an ER/PR positive breast cancer would consider an aromatase inhibitor or tamoxifen for systemic treatment. Given patient's difficulty with travel, monthly Xgeva or Zometa would likely be difficult as well.  Patient also has a bone scan scheduled and will follow-up 1-2 days after to discuss the results and possible treatment planning.  2. Pulmonary embolism: Patient is not a candidate for anticoagulation. Can consider filter placement at the time of orthopedic procedure if additional concern.  Approximately 60 minutes was spent in discussion of which 50% was spent in consultation.  Patient expressed understanding and was in agreement with this plan. She also understands that She can call clinic at any time with any questions, concerns, or complaints.   Cancer Staging No matching staging information was found for the patient.  Lloyd Huger, MD   03/25/2017 8:27 AM  Addendum: Patient noted to have an incidental pulmonary embolism on CT scan is indicated as above. She is not a candidate for anticoagulation. Okay to pursue anesthesia cautiously at the direction of orthopedic surgery for her left arm.

## 2017-03-24 ENCOUNTER — Encounter: Payer: Self-pay | Admitting: Oncology

## 2017-03-24 ENCOUNTER — Other Ambulatory Visit: Payer: Self-pay

## 2017-03-24 ENCOUNTER — Encounter (INDEPENDENT_AMBULATORY_CARE_PROVIDER_SITE_OTHER): Payer: Self-pay

## 2017-03-24 ENCOUNTER — Ambulatory Visit
Admission: RE | Admit: 2017-03-24 | Discharge: 2017-03-24 | Disposition: A | Discharge status: Home / Self Care | Payer: Medicare Other | Source: Ambulatory Visit | Attending: Oncology | Admitting: Oncology

## 2017-03-24 ENCOUNTER — Inpatient Hospital Stay: Payer: Medicare Other | Attending: Oncology | Admitting: Oncology

## 2017-03-24 ENCOUNTER — Inpatient Hospital Stay: Payer: Medicare Other

## 2017-03-24 DIAGNOSIS — M84522S Pathological fracture in neoplastic disease, left humerus, sequela: Secondary | ICD-10-CM | POA: Diagnosis not present

## 2017-03-24 DIAGNOSIS — H409 Unspecified glaucoma: Secondary | ICD-10-CM | POA: Diagnosis not present

## 2017-03-24 DIAGNOSIS — K449 Diaphragmatic hernia without obstruction or gangrene: Secondary | ICD-10-CM | POA: Insufficient documentation

## 2017-03-24 DIAGNOSIS — I480 Paroxysmal atrial fibrillation: Secondary | ICD-10-CM | POA: Diagnosis not present

## 2017-03-24 DIAGNOSIS — C7951 Secondary malignant neoplasm of bone: Secondary | ICD-10-CM | POA: Insufficient documentation

## 2017-03-24 DIAGNOSIS — M84522A Pathological fracture in neoplastic disease, left humerus, initial encounter for fracture: Secondary | ICD-10-CM | POA: Insufficient documentation

## 2017-03-24 DIAGNOSIS — R978 Other abnormal tumor markers: Secondary | ICD-10-CM | POA: Diagnosis not present

## 2017-03-24 DIAGNOSIS — K8020 Calculus of gallbladder without cholecystitis without obstruction: Secondary | ICD-10-CM | POA: Diagnosis not present

## 2017-03-24 DIAGNOSIS — I2699 Other pulmonary embolism without acute cor pulmonale: Secondary | ICD-10-CM | POA: Diagnosis not present

## 2017-03-24 DIAGNOSIS — N200 Calculus of kidney: Secondary | ICD-10-CM | POA: Diagnosis not present

## 2017-03-24 DIAGNOSIS — N6459 Other signs and symptoms in breast: Secondary | ICD-10-CM | POA: Diagnosis not present

## 2017-03-24 DIAGNOSIS — I700 Atherosclerosis of aorta: Secondary | ICD-10-CM | POA: Insufficient documentation

## 2017-03-24 DIAGNOSIS — Z79899 Other long term (current) drug therapy: Secondary | ICD-10-CM | POA: Insufficient documentation

## 2017-03-24 DIAGNOSIS — J984 Other disorders of lung: Secondary | ICD-10-CM | POA: Diagnosis not present

## 2017-03-24 DIAGNOSIS — M4854XA Collapsed vertebra, not elsewhere classified, thoracic region, initial encounter for fracture: Secondary | ICD-10-CM | POA: Diagnosis not present

## 2017-03-24 DIAGNOSIS — C801 Malignant (primary) neoplasm, unspecified: Secondary | ICD-10-CM | POA: Diagnosis not present

## 2017-03-24 DIAGNOSIS — Z8673 Personal history of transient ischemic attack (TIA), and cerebral infarction without residual deficits: Secondary | ICD-10-CM | POA: Diagnosis not present

## 2017-03-24 DIAGNOSIS — D509 Iron deficiency anemia, unspecified: Secondary | ICD-10-CM | POA: Diagnosis not present

## 2017-03-24 DIAGNOSIS — F0390 Unspecified dementia without behavioral disturbance: Secondary | ICD-10-CM | POA: Diagnosis not present

## 2017-03-24 DIAGNOSIS — E785 Hyperlipidemia, unspecified: Secondary | ICD-10-CM | POA: Diagnosis not present

## 2017-03-24 DIAGNOSIS — I872 Venous insufficiency (chronic) (peripheral): Secondary | ICD-10-CM | POA: Diagnosis not present

## 2017-03-24 DIAGNOSIS — I10 Essential (primary) hypertension: Secondary | ICD-10-CM | POA: Diagnosis not present

## 2017-03-24 DIAGNOSIS — K439 Ventral hernia without obstruction or gangrene: Secondary | ICD-10-CM | POA: Insufficient documentation

## 2017-03-24 DIAGNOSIS — Z880 Allergy status to penicillin: Secondary | ICD-10-CM | POA: Diagnosis not present

## 2017-03-24 LAB — CBC WITH DIFFERENTIAL/PLATELET
BASOS ABS: 0.1 10*3/uL (ref 0–0.1)
BASOS PCT: 1 %
Eosinophils Absolute: 0.3 10*3/uL (ref 0–0.7)
Eosinophils Relative: 3 %
HEMATOCRIT: 38.5 % (ref 35.0–47.0)
HEMOGLOBIN: 13.1 g/dL (ref 12.0–16.0)
Lymphocytes Relative: 21 %
Lymphs Abs: 2 10*3/uL (ref 1.0–3.6)
MCH: 31.5 pg (ref 26.0–34.0)
MCHC: 34.2 g/dL (ref 32.0–36.0)
MCV: 92.1 fL (ref 80.0–100.0)
Monocytes Absolute: 0.9 10*3/uL (ref 0.2–0.9)
Monocytes Relative: 9 %
NEUTROS ABS: 6.5 10*3/uL (ref 1.4–6.5)
NEUTROS PCT: 66 %
Platelets: 259 10*3/uL (ref 150–440)
RBC: 4.18 MIL/uL (ref 3.80–5.20)
RDW: 15 % — ABNORMAL HIGH (ref 11.5–14.5)
WBC: 9.7 10*3/uL (ref 3.6–11.0)

## 2017-03-24 LAB — COMPREHENSIVE METABOLIC PANEL
ALBUMIN: 3.2 g/dL — AB (ref 3.5–5.0)
ALK PHOS: 287 U/L — AB (ref 38–126)
ALT: 10 U/L — ABNORMAL LOW (ref 14–54)
ANION GAP: 8 (ref 5–15)
AST: 25 U/L (ref 15–41)
BUN: 40 mg/dL — ABNORMAL HIGH (ref 6–20)
CALCIUM: 9.6 mg/dL (ref 8.9–10.3)
CHLORIDE: 99 mmol/L — AB (ref 101–111)
CO2: 29 mmol/L (ref 22–32)
Creatinine, Ser: 1.37 mg/dL — ABNORMAL HIGH (ref 0.44–1.00)
GFR calc non Af Amer: 33 mL/min — ABNORMAL LOW (ref >60)
GFR, EST AFRICAN AMERICAN: 38 mL/min — AB (ref >60)
GLUCOSE: 126 mg/dL — AB (ref 65–99)
POTASSIUM: 4 mmol/L (ref 3.5–5.1)
SODIUM: 136 mmol/L (ref 135–145)
Total Bilirubin: 0.6 mg/dL (ref 0.3–1.2)
Total Protein: 7.2 g/dL (ref 6.5–8.1)

## 2017-03-24 MED ORDER — IOPAMIDOL (ISOVUE-300) INJECTION 61%
75.0000 mL | Freq: Once | INTRAVENOUS | Status: AC | PRN
  Administered 2017-03-24: 75 mL via INTRAVENOUS

## 2017-03-24 NOTE — Progress Notes (Signed)
New evaluation for pathologic fracture to left arm due to possible metastatic disease. Pt unable to stand due to right sided weakness.

## 2017-03-25 LAB — PROTEIN ELECTROPHORESIS, SERUM
A/G RATIO SPE: 0.9 (ref 0.7–1.7)
ALPHA-1-GLOBULIN: 0.4 g/dL (ref 0.0–0.4)
ALPHA-2-GLOBULIN: 1.2 g/dL — AB (ref 0.4–1.0)
Albumin ELP: 3.1 g/dL (ref 2.9–4.4)
Beta Globulin: 1 g/dL (ref 0.7–1.3)
Gamma Globulin: 1 g/dL (ref 0.4–1.8)
Globulin, Total: 3.6 g/dL (ref 2.2–3.9)
Total Protein ELP: 6.7 g/dL (ref 6.0–8.5)

## 2017-03-25 LAB — CANCER ANTIGEN 27.29: CA 27.29: 206.7 U/mL — ABNORMAL HIGH (ref 0.0–38.6)

## 2017-03-25 LAB — CEA: CEA: 109.3 ng/mL — ABNORMAL HIGH (ref 0.0–4.7)

## 2017-03-25 LAB — CA 125: CA 125: 186.6 U/mL — ABNORMAL HIGH (ref 0.0–38.1)

## 2017-03-25 LAB — CANCER ANTIGEN 19-9: CA 19-9: 20 U/mL (ref 0–35)

## 2017-03-26 ENCOUNTER — Telehealth: Payer: Self-pay | Admitting: *Deleted

## 2017-03-26 DIAGNOSIS — C50919 Malignant neoplasm of unspecified site of unspecified female breast: Secondary | ICD-10-CM

## 2017-03-26 DIAGNOSIS — C7951 Secondary malignant neoplasm of bone: Secondary | ICD-10-CM | POA: Diagnosis not present

## 2017-03-26 NOTE — Telephone Encounter (Signed)
Asking for results of CT scan. Please retuin his call [phone]  IMPRESSION: 1. Widespread osseous metastatic disease with a mixed lytic and sclerotic pattern. Pathologic fractures involving left humerus and T9 vertebra noted. 2. There is a solid, enhancing lesion within the right breast which is suspicious and may reflect primary breast carcinoma. 3. Left lower lobe and right upper lobe pulmonary artery filling defects compatible with pulmonary embolus. 4. Aortic Atherosclerosis (ICD10-I70.0), Gallstone, left renal calculus, hiatal hernia, and ventral abdominal wall hernia.  Critical Value/emergent results were called by telephone at the time of interpretation on 03/24/2017 at 1:32 pm to Dr. Delight Hoh , who verbally acknowledged these results.

## 2017-03-27 ENCOUNTER — Other Ambulatory Visit: Payer: Self-pay | Admitting: *Deleted

## 2017-03-27 ENCOUNTER — Telehealth: Payer: Self-pay | Admitting: *Deleted

## 2017-03-27 NOTE — Telephone Encounter (Signed)
i'm ok with canceled bone scan.  I will page Dr. Sabra Heck this afternoon.

## 2017-03-27 NOTE — Telephone Encounter (Signed)
I have notified her son of the canceled Bone Scan, but he wanted to know if she needed to come to see Woodfin Ganja on Tuesday 04/01/17. I did not cancel that appt yet. Please advise.  Thank you

## 2017-03-27 NOTE — Telephone Encounter (Signed)
I have called and paged Dr. Sabra Heck with no call back.  Will try again on Monday.

## 2017-03-27 NOTE — Telephone Encounter (Signed)
Inquiring if Dr Silvio Pate and Dr Grayland Ormond spoke yesterday and if the Bone Scan is being cancelled for Monday. He states that Dr Silvio Pate wants to change the entire plan. I called [place] to ask if Dr Grayland Ormond spoke with Dr Silvio Pate yesterday and was told that he did and that he has a call in to Dr [facility] and it will ultimately be his decision as to the plan.   Mr Jacome who has POA requests that some one notify him of the outcome of Dr Sabra Heck' s decision and whether or not the  Bone scan for Monday will be cancelled. [phone]

## 2017-03-29 NOTE — Progress Notes (Deleted)
[place]  [phone]) [phone] [phone]) [phone]  ID: Allison Quinn OB: 05/16/1926  MR#: [id number]  ZWC#:[id number]  Patient Care Team: Venia Carbon, MD as PCP - General  CHIEF COMPLAINT: Bone metastasis  INTERVAL HISTORY: Patient is an 83 year old female who is a resident of nursing home who presented with a fractured left humerus. Subsequent workup indicated fracture was likely pathologic. Patient is hard of hearing and demented and much the history is given by her son. He does not report any falls. She has no new neurologic complaints. She has no fevers or recent illnesses. She has not complained of chest pain or shortness of breath. She has a fair appetite and there is no report of weight loss. She has no nausea, vomiting, constipation, or diarrhea. She has no urinary complaints. Patient appears to be at her baseline and offers no specific complaints today.  REVIEW OF SYSTEMS:   Review of Systems  Unable to perform ROS: Dementia  All other systems reviewed and are negative.   As per HPI. Otherwise, a complete review of systems is negative.  PAST MEDICAL HISTORY: Past Medical History:  Diagnosis Date  . Anxiety disorder, unspecified   . Arthritis   . Arthropathy   . Edema   . Essential hypertension   . GERD (gastroesophageal reflux disease)   . Glaucoma   . Hyperlipidemia   . IDA (iron deficiency anemia)   . Major depressive disorder   . Other allergic rhinitis   . Paroxysmal atrial fibrillation (HCC)   . Stroke ([place])   . Venous insufficiency (chronic) (peripheral)     PAST SURGICAL HISTORY: Past Surgical History:  Procedure Laterality Date  . ABDOMINAL HYSTERECTOMY      FAMILY HISTORY: No family history on file.  ADVANCED DIRECTIVES (Y/N):  N  HEALTH MAINTENANCE: Social History  Substance Use Topics  . Smoking status: Not on file  . Smokeless tobacco: Not on file  . Alcohol use Not on file      Colonoscopy:  PAP:  Bone density:  Lipid panel:  Allergies  Allergen Reactions  . Codeine   . Lincocin [Lincomycin Hcl]   . Penicillins   . Tetracyclines & Related     Current Outpatient Prescriptions  Medication Sig Dispense Refill  . acetaminophen (TYLENOL) 325 MG tablet Take 650 mg by mouth 4 (four) times daily as needed.    Marland Kitchen alum & mag hydroxide-simeth (MAALOX/MYLANTA) 200-200-20 MG/5ML suspension Take 30 mLs by mouth every 4 (four) hours as needed for indigestion or heartburn.    Marland Kitchen aspirin 81 MG chewable tablet Chew 81 mg by mouth daily.    . carbamide peroxide (DEBROX) 6.5 % otic solution Place 5 drops into both ears as needed.    . carvedilol (COREG) 3.125 MG tablet Take 3.125 mg by mouth 2 (two) times daily with a meal.    . clindamycin (CLEOCIN) 150 MG capsule Take by mouth once. Take 4 capsules PO 1 hour prior to dental procedure    . diclofenac sodium (VOLTAREN) 1 % GEL Apply 2 g topically 2 (two) times daily.    . Emollient (EUCERIN) lotion Apply topically as needed for dry skin.    Marland Kitchen ergocalciferol (VITAMIN D2) 50000 units capsule Take 50,000 Units by mouth every 30 (thirty) days.    . fexofenadine (ALLEGRA) 180 MG tablet Take 180 mg by mouth daily as needed for allergies or rhinitis.    Marland Kitchen FLUoxetine (PROZAC) 20 MG capsule Take 20 mg by mouth daily.    Marland Kitchen  fluticasone (FLONASE) 50 MCG/ACT nasal spray Place 2 sprays into both nostrils daily.    . furosemide (LASIX) 80 MG tablet Take 80 mg by mouth as needed. If weight is greater than 200, give at 1pm    . furosemide (LASIX) 80 MG tablet Take 80 mg by mouth daily.    Marland Kitchen guaifenesin (ROBITUSSIN) 100 MG/5ML syrup Take 200 mg by mouth every 4 (four) hours as needed for cough.    Marland Kitchen HYDROcodone-acetaminophen (NORCO/VICODIN) 5-325 MG tablet Take 1 tablet by mouth 2 (two) times daily.    . hydroxypropyl methylcellulose / hypromellose (ISOPTO TEARS / GONIOVISC) 2.5 % ophthalmic solution Place 1 drop into both eyes at bedtime.     Marland Kitchen ipratropium-albuterol (DUONEB) 0.5-2.5 (3) MG/3ML SOLN Take 3 mLs by nebulization every 4 (four) hours as needed.    . loratadine (CLARITIN) 10 MG tablet Take 10 mg by mouth at bedtime as needed for allergies.    Marland Kitchen loratadine (CLARITIN) 10 MG tablet Take 10 mg by mouth daily.    Marland Kitchen LORazepam (ATIVAN) 0.5 MG tablet Take 0.25 mg by mouth 2 (two) times daily.    Marland Kitchen losartan (COZAAR) 25 MG tablet Take 25 mg by mouth daily.    . montelukast (SINGULAIR) 10 MG tablet Take 10 mg by mouth at bedtime.    . Multiple Vitamin (MULTIVITAMIN) tablet Take 1 tablet by mouth daily.    . pantoprazole (PROTONIX) 40 MG tablet Take 40 mg by mouth daily.    . sennosides-docusate sodium (SENOKOT-S) 8.6-50 MG tablet Take 1 tablet by mouth daily.    . simvastatin (ZOCOR) 40 MG tablet Take 40 mg by mouth daily at 6 PM.    . traMADol (ULTRAM) 50 MG tablet Take by mouth 2 (two) times daily as needed.     No current facility-administered medications for this visit.     OBJECTIVE: There were no vitals filed for this visit.   There is no height or weight on file to calculate BMI.    ECOG FS:2 - Symptomatic, <50% confined to bed  General: Well-developed, well-nourished, no acute distress. Eyes: Pink conjunctiva, anicteric sclera. HEENT: Normocephalic, moist mucous membranes, clear oropharnyx. Lungs: Clear to auscultation bilaterally. Heart: Regular rate and rhythm. No rubs, murmurs, or gallops. Abdomen: Soft, nontender, nondistended. No organomegaly noted, normoactive bowel sounds. Musculoskeletal: Left arm and temporary cast. Neuro: Confused. Cranial nerves grossly intact. Significant weakness on right side secondary to previous CVA. Skin: No rashes or petechiae noted. Psych: Confused. Lymphatics: No cervical, calvicular, axillary or inguinal LAD.   LAB RESULTS:  Lab Results  Component Value Date   NA 136 03/24/2017   K 4.0 03/24/2017   CL 99 (L) 03/24/2017   CO2 29 03/24/2017   GLUCOSE 126 (H) 03/24/2017    BUN 40 (H) 03/24/2017   CREATININE 1.37 (H) 03/24/2017   CALCIUM 9.6 03/24/2017   PROT 7.2 03/24/2017   ALBUMIN 3.2 (L) 03/24/2017   AST 25 03/24/2017   ALT 10 (L) 03/24/2017   ALKPHOS 287 (H) 03/24/2017   BILITOT 0.6 03/24/2017   GFRNONAA 33 (L) 03/24/2017   GFRAA 38 (L) 03/24/2017    Lab Results  Component Value Date   WBC 9.7 03/24/2017   NEUTROABS 6.5 03/24/2017   HGB 13.1 03/24/2017   HCT 38.5 03/24/2017   MCV 92.1 03/24/2017   PLT 259 03/24/2017   Lab Results  Component Value Date   LABCA2 206.7 (H) 03/24/2017   Lab Results  Component Value Date   CA125 186.6 (H)  03/24/2017   Lab Results  Component Value Date   CEA 109.3 (H) 03/24/2017     STUDIES: Ct Chest W Contrast  Result Date: 03/24/2017 CLINICAL DATA:  Metastatic disease.  Staging. EXAM: CT CHEST, ABDOMEN, AND PELVIS WITH CONTRAST TECHNIQUE: Multidetector CT imaging of the chest, abdomen and pelvis was performed following the standard protocol during bolus administration of intravenous contrast. CONTRAST:  32mL ISOVUE-300 IOPAMIDOL (ISOVUE-300) INJECTION 61% COMPARISON:  None FINDINGS: CT CHEST FINDINGS Cardiovascular: Moderate cardiac enlargement. Aortic atherosclerosis noted. Filling defect identified within the central left lower lobe pulmonary artery, image 27 of series 2 and right upper lobe pulmonary artery, image 68 of series 6. Mediastinum/Nodes: Moderate to large hiatal hernia. The trachea appears patent and midline. No enlarged mediastinal or hilar adenopathy. No axillary or supraclavicular adenopathy. Lungs/Pleura: No pleural effusion. No suspicious pulmonary nodules or masses. Musculoskeletal: Mass in the central right breast measures 2.3 cm, image 28 of series 2. No enlarged right axillary or supraclavicular lymph nodes. Diffuse mixed lytic and sclerotic bone metastases are identified throughout the visualized osseous structures compatible with widespread bone metastases. Compression fracture of T9  is identified which may be pathologic. Pathologic fracture involving the left humerus is noted with underlying of permitted bone lesion. Extensive permeative changes involving the proximal right humerus also noted which may be of orthopedic significance. CT ABDOMEN PELVIS FINDINGS Hepatobiliary: No suspicious liver abnormality. Stone within the gallbladder measures 1.7 cm. Pancreas: Diffuse atrophy of the pancreas.  No mass identified. Spleen: The spleen is unremarkable. Adrenals/Urinary Tract: Normal adrenal glands. Left renal atrophy. Multiple areas of cortical scarring are noted involving both kidneys. Small bilateral renal cysts are less than 1 cm in too small to reliably characterize. Stone within the upper pole of the left kidney measures 4 mm. No mass or hydronephrosis. The urinary bladder appears normal. Stomach/Bowel: Large hiatal hernia. The small bowel loops have a normal course and caliber. The appendix is upper limits of normal in caliber measuring 8 mm without surrounding inflammatory change. There is no pathologic dilatation of the colon. Vascular/Lymphatic: Aortic atherosclerosis. No aneurysm. No upper abdominal adenopathy. No pelvic or inguinal adenopathy identified. Reproductive: Previous hysterectomy.  No adnexal mass. Other: There is a large periumbilical hernia which contains fat only, image 79 of series 2. Musculoskeletal: Extensive mixed lytic and sclerotic bone lesions are identified. IMPRESSION: 1. Widespread osseous metastatic disease with a mixed lytic and sclerotic pattern. Pathologic fractures involving left humerus and T9 vertebra noted. 2. There is a solid, enhancing lesion within the right breast which is suspicious and may reflect primary breast carcinoma. 3. Left lower lobe and right upper lobe pulmonary artery filling defects compatible with pulmonary embolus. 4. Aortic Atherosclerosis (ICD10-I70.0), Gallstone, left renal calculus, hiatal hernia, and ventral abdominal wall hernia.  Critical Value/emergent results were called by telephone at the time of interpretation on 03/24/2017 at 1:32 pm to Dr. Delight Hoh , who verbally acknowledged these results. Electronically Signed   By: Kerby Moors M.D.   On: 03/24/2017 13:32   Ct Abdomen Pelvis W Contrast  Result Date: 03/24/2017 CLINICAL DATA:  Metastatic disease.  Staging. EXAM: CT CHEST, ABDOMEN, AND PELVIS WITH CONTRAST TECHNIQUE: Multidetector CT imaging of the chest, abdomen and pelvis was performed following the standard protocol during bolus administration of intravenous contrast. CONTRAST:  54mL ISOVUE-300 IOPAMIDOL (ISOVUE-300) INJECTION 61% COMPARISON:  None FINDINGS: CT CHEST FINDINGS Cardiovascular: Moderate cardiac enlargement. Aortic atherosclerosis noted. Filling defect identified within the central left lower lobe pulmonary artery, image 27 of  series 2 and right upper lobe pulmonary artery, image 68 of series 6. Mediastinum/Nodes: Moderate to large hiatal hernia. The trachea appears patent and midline. No enlarged mediastinal or hilar adenopathy. No axillary or supraclavicular adenopathy. Lungs/Pleura: No pleural effusion. No suspicious pulmonary nodules or masses. Musculoskeletal: Mass in the central right breast measures 2.3 cm, image 28 of series 2. No enlarged right axillary or supraclavicular lymph nodes. Diffuse mixed lytic and sclerotic bone metastases are identified throughout the visualized osseous structures compatible with widespread bone metastases. Compression fracture of T9 is identified which may be pathologic. Pathologic fracture involving the left humerus is noted with underlying of permitted bone lesion. Extensive permeative changes involving the proximal right humerus also noted which may be of orthopedic significance. CT ABDOMEN PELVIS FINDINGS Hepatobiliary: No suspicious liver abnormality. Stone within the gallbladder measures 1.7 cm. Pancreas: Diffuse atrophy of the pancreas.  No mass identified.  Spleen: The spleen is unremarkable. Adrenals/Urinary Tract: Normal adrenal glands. Left renal atrophy. Multiple areas of cortical scarring are noted involving both kidneys. Small bilateral renal cysts are less than 1 cm in too small to reliably characterize. Stone within the upper pole of the left kidney measures 4 mm. No mass or hydronephrosis. The urinary bladder appears normal. Stomach/Bowel: Large hiatal hernia. The small bowel loops have a normal course and caliber. The appendix is upper limits of normal in caliber measuring 8 mm without surrounding inflammatory change. There is no pathologic dilatation of the colon. Vascular/Lymphatic: Aortic atherosclerosis. No aneurysm. No upper abdominal adenopathy. No pelvic or inguinal adenopathy identified. Reproductive: Previous hysterectomy.  No adnexal mass. Other: There is a large periumbilical hernia which contains fat only, image 79 of series 2. Musculoskeletal: Extensive mixed lytic and sclerotic bone lesions are identified. IMPRESSION: 1. Widespread osseous metastatic disease with a mixed lytic and sclerotic pattern. Pathologic fractures involving left humerus and T9 vertebra noted. 2. There is a solid, enhancing lesion within the right breast which is suspicious and may reflect primary breast carcinoma. 3. Left lower lobe and right upper lobe pulmonary artery filling defects compatible with pulmonary embolus. 4. Aortic Atherosclerosis (ICD10-I70.0), Gallstone, left renal calculus, hiatal hernia, and ventral abdominal wall hernia. Critical Value/emergent results were called by telephone at the time of interpretation on 03/24/2017 at 1:32 pm to Dr. Delight Hoh , who verbally acknowledged these results. Electronically Signed   By: Kerby Moors M.D.   On: 03/24/2017 13:32    ASSESSMENT: Bone metastasis  PLAN:    1. Bone metastasis: CT scan results reviewed independently and reported as above with widespread osseous metastasis. Pathologic fracture noted  in left humerus as well as T9 vertebrae. Given the breast mass seen on CT scan as well as her elevated CA-27-29 is highly suspicious for metastatic breast cancer. Plan is to have surgical repair of her left humerus at which time a biopsy can be taken of her pathologic fracture to confirm the diagnosis. Patient is not a candidate for chemotherapy, but if proven to be an ER/PR positive breast cancer would consider an aromatase inhibitor or tamoxifen for systemic treatment. Given patient's difficulty with travel, monthly Xgeva or Zometa would likely be difficult as well.  Patient also has a bone scan scheduled and will follow-up 1-2 days after to discuss the results and possible treatment planning.  2. Pulmonary embolism: Patient is not a candidate for anticoagulation. Can consider filter placement at the time of orthopedic procedure if additional concern.  Approximately 60 minutes was spent in discussion of which 50% was  spent in consultation.  Patient expressed understanding and was in agreement with this plan. She also understands that She can call clinic at any time with any questions, concerns, or complaints.   Cancer Staging No matching staging information was found for the patient.  Lloyd Huger, MD   03/29/2017 11:16 PM

## 2017-03-30 NOTE — Telephone Encounter (Signed)
Notified [facility] that patient does not need to come tomorrow for appt at [facility], receptionist stated she would let Melody Jerline Pain know this. Family will come in and speak with MD. Patient has appt Thursday with Dr Sabra Heck per son

## 2017-03-31 ENCOUNTER — Inpatient Hospital Stay: Payer: Medicare Other | Admitting: Oncology

## 2017-03-31 DIAGNOSIS — C7951 Secondary malignant neoplasm of bone: Secondary | ICD-10-CM

## 2017-04-03 ENCOUNTER — Telehealth: Payer: Self-pay | Admitting: *Deleted

## 2017-04-03 ENCOUNTER — Other Ambulatory Visit: Payer: Self-pay | Admitting: Specialist

## 2017-04-03 ENCOUNTER — Encounter: Payer: Self-pay | Admitting: Oncology

## 2017-04-03 ENCOUNTER — Telehealth: Payer: Self-pay

## 2017-04-03 NOTE — Telephone Encounter (Signed)
I have the form. I am waiting for results of a CBC that needs to be drawn.

## 2017-04-03 NOTE — Telephone Encounter (Signed)
Allison Quinn, pts daughter (do not see DPR signed) left v/m; pt is possibly going to have surgery and Mayukha wants to know the status of the surgical clearance request that was sent.I do not see under media tab. Pt is resident at [facility]. [name] request cb.

## 2017-04-03 NOTE — Telephone Encounter (Signed)
Cannot see note in my chart and wants you to make it visible to him. Also reports that per Dr Sabra Heck he never got a note form you either. I printed and faxed this to his office this morning.

## 2017-04-04 NOTE — Telephone Encounter (Signed)
There should only be one note in the chart at this time.

## 2017-04-06 NOTE — Progress Notes (Signed)
Patient was not present at clinic appointment today. Discussed at length patient's possible diagnosis as well as prognosis with her son and daughter. They do not wish aggressive treatment, but would agree to an aromatase inhibitor if her metastatic disease is ER/PR positive breast cancer. Have instructed them to keep her previously scheduled follow-up appointment with orthopedics to discuss repair of her left humerus. Patient will keep her previously scheduled follow-up appointment.

## 2017-04-07 ENCOUNTER — Telehealth: Payer: Self-pay | Admitting: Internal Medicine

## 2017-04-07 DIAGNOSIS — I480 Paroxysmal atrial fibrillation: Secondary | ICD-10-CM | POA: Diagnosis not present

## 2017-04-07 DIAGNOSIS — I872 Venous insufficiency (chronic) (peripheral): Secondary | ICD-10-CM | POA: Diagnosis not present

## 2017-04-07 DIAGNOSIS — Z01818 Encounter for other preprocedural examination: Secondary | ICD-10-CM | POA: Diagnosis not present

## 2017-04-07 NOTE — Telephone Encounter (Signed)
Allison Quinn is taking care of this.

## 2017-04-07 NOTE — Telephone Encounter (Signed)
Sherry,[facility],called.  She needs to get pre-op clearance for patient for left humerus fracture surgery by Dr.Miller.  Patient is having surgery tomorrow and she needs clearance form faxed back to her before 5:00 today.  Clearance form is on Dr.Letvak's desk.

## 2017-04-07 NOTE — Telephone Encounter (Signed)
Dr. Silvio Pate signed this at the nursing home this am.

## 2017-04-08 ENCOUNTER — Ambulatory Visit: Payer: Medicare Other | Admitting: Anesthesiology

## 2017-04-08 ENCOUNTER — Encounter: Payer: Self-pay | Admitting: *Deleted

## 2017-04-08 ENCOUNTER — Other Ambulatory Visit: Payer: Self-pay

## 2017-04-08 ENCOUNTER — Observation Stay: Payer: Medicare Other

## 2017-04-08 ENCOUNTER — Encounter
Admission: RE | Disposition: A | Discharge status: Skilled Nursing Facility | Payer: Self-pay | Source: Ambulatory Visit | Attending: Specialist

## 2017-04-08 ENCOUNTER — Ambulatory Visit: Payer: Medicare Other

## 2017-04-08 ENCOUNTER — Observation Stay
Admission: RE | Admit: 2017-04-08 | Discharge: 2017-04-09 | Disposition: A | Discharge status: Skilled Nursing Facility | Payer: Medicare Other | Source: Ambulatory Visit | Attending: Specialist | Admitting: Specialist

## 2017-04-08 DIAGNOSIS — C7952 Secondary malignant neoplasm of bone marrow: Secondary | ICD-10-CM | POA: Diagnosis not present

## 2017-04-08 DIAGNOSIS — Z791 Long term (current) use of non-steroidal anti-inflammatories (NSAID): Secondary | ICD-10-CM | POA: Diagnosis not present

## 2017-04-08 DIAGNOSIS — I2510 Atherosclerotic heart disease of native coronary artery without angina pectoris: Secondary | ICD-10-CM | POA: Insufficient documentation

## 2017-04-08 DIAGNOSIS — Z91048 Other nonmedicinal substance allergy status: Secondary | ICD-10-CM | POA: Insufficient documentation

## 2017-04-08 DIAGNOSIS — K219 Gastro-esophageal reflux disease without esophagitis: Secondary | ICD-10-CM | POA: Diagnosis not present

## 2017-04-08 DIAGNOSIS — Z79899 Other long term (current) drug therapy: Secondary | ICD-10-CM | POA: Insufficient documentation

## 2017-04-08 DIAGNOSIS — Z885 Allergy status to narcotic agent status: Secondary | ICD-10-CM | POA: Insufficient documentation

## 2017-04-08 DIAGNOSIS — M84422A Pathological fracture, left humerus, initial encounter for fracture: Secondary | ICD-10-CM | POA: Diagnosis present

## 2017-04-08 DIAGNOSIS — Z7982 Long term (current) use of aspirin: Secondary | ICD-10-CM | POA: Insufficient documentation

## 2017-04-08 DIAGNOSIS — Z881 Allergy status to other antibiotic agents status: Secondary | ICD-10-CM | POA: Insufficient documentation

## 2017-04-08 DIAGNOSIS — Z419 Encounter for procedure for purposes other than remedying health state, unspecified: Secondary | ICD-10-CM

## 2017-04-08 DIAGNOSIS — Z888 Allergy status to other drugs, medicaments and biological substances status: Secondary | ICD-10-CM | POA: Insufficient documentation

## 2017-04-08 DIAGNOSIS — J449 Chronic obstructive pulmonary disease, unspecified: Secondary | ICD-10-CM | POA: Insufficient documentation

## 2017-04-08 DIAGNOSIS — I10 Essential (primary) hypertension: Secondary | ICD-10-CM | POA: Insufficient documentation

## 2017-04-08 DIAGNOSIS — Z7951 Long term (current) use of inhaled steroids: Secondary | ICD-10-CM | POA: Diagnosis not present

## 2017-04-08 DIAGNOSIS — Z880 Allergy status to penicillin: Secondary | ICD-10-CM | POA: Insufficient documentation

## 2017-04-08 DIAGNOSIS — G8191 Hemiplegia, unspecified affecting right dominant side: Secondary | ICD-10-CM | POA: Diagnosis not present

## 2017-04-08 DIAGNOSIS — Z8673 Personal history of transient ischemic attack (TIA), and cerebral infarction without residual deficits: Secondary | ICD-10-CM | POA: Insufficient documentation

## 2017-04-08 DIAGNOSIS — I440 Atrioventricular block, first degree: Secondary | ICD-10-CM | POA: Diagnosis not present

## 2017-04-08 DIAGNOSIS — F418 Other specified anxiety disorders: Secondary | ICD-10-CM | POA: Diagnosis not present

## 2017-04-08 DIAGNOSIS — S42352A Displaced comminuted fracture of shaft of humerus, left arm, initial encounter for closed fracture: Secondary | ICD-10-CM

## 2017-04-08 HISTORY — DX: Hemiplegia, unspecified affecting right dominant side: G81.91

## 2017-04-08 HISTORY — PX: HUMERUS IM NAIL: SHX1769

## 2017-04-08 HISTORY — DX: Anxiety disorder, unspecified: F41.9

## 2017-04-08 LAB — URINALYSIS, ROUTINE W REFLEX MICROSCOPIC
Bilirubin Urine: NEGATIVE
GLUCOSE, UA: NEGATIVE mg/dL
HGB URINE DIPSTICK: NEGATIVE
Ketones, ur: NEGATIVE mg/dL
LEUKOCYTES UA: NEGATIVE
Nitrite: NEGATIVE
PH: 6 (ref 5.0–8.0)
PROTEIN: NEGATIVE mg/dL
SPECIFIC GRAVITY, URINE: 1.014 (ref 1.005–1.030)

## 2017-04-08 LAB — CBC
HEMATOCRIT: 36.6 % (ref 35.0–47.0)
HEMOGLOBIN: 12.2 g/dL (ref 12.0–16.0)
MCH: 30.8 pg (ref 26.0–34.0)
MCHC: 33.4 g/dL (ref 32.0–36.0)
MCV: 92.2 fL (ref 80.0–100.0)
Platelets: 250 10*3/uL (ref 150–440)
RBC: 3.97 MIL/uL (ref 3.80–5.20)
RDW: 15.4 % — ABNORMAL HIGH (ref 11.5–14.5)
WBC: 18.4 10*3/uL — ABNORMAL HIGH (ref 3.6–11.0)

## 2017-04-08 LAB — CREATININE, SERUM
Creatinine, Ser: 1.14 mg/dL — ABNORMAL HIGH (ref 0.44–1.00)
GFR calc Af Amer: 48 mL/min — ABNORMAL LOW (ref >60)
GFR calc non Af Amer: 41 mL/min — ABNORMAL LOW (ref >60)

## 2017-04-08 LAB — PROTIME-INR
INR: 1.04
Prothrombin Time: 13.6 seconds (ref 11.4–15.2)

## 2017-04-08 SURGERY — INSERTION, INTRAMEDULLARY ROD, HUMERUS
Anesthesia: General | Laterality: Left | Wound class: Clean

## 2017-04-08 MED ORDER — POLYVINYL ALCOHOL 1.4 % OP SOLN
1.0000 [drp] | OPHTHALMIC | Status: DC | PRN
  Filled 2017-04-08: qty 15

## 2017-04-08 MED ORDER — FENTANYL CITRATE (PF) 250 MCG/5ML IJ SOLN
INTRAMUSCULAR | Status: AC
  Filled 2017-04-08: qty 5

## 2017-04-08 MED ORDER — SODIUM CHLORIDE 0.45 % IV SOLN
INTRAVENOUS | Status: DC
  Administered 2017-04-08: 18:00:00 via INTRAVENOUS

## 2017-04-08 MED ORDER — CEFAZOLIN SODIUM-DEXTROSE 2-4 GM/100ML-% IV SOLN
INTRAVENOUS | Status: AC
  Filled 2017-04-08: qty 100

## 2017-04-08 MED ORDER — LACTATED RINGERS IV SOLN
INTRAVENOUS | Status: DC
  Administered 2017-04-08 (×2): via INTRAVENOUS

## 2017-04-08 MED ORDER — PROPOFOL 10 MG/ML IV BOLUS
INTRAVENOUS | Status: AC
  Filled 2017-04-08: qty 20

## 2017-04-08 MED ORDER — CELECOXIB 200 MG PO CAPS
200.0000 mg | ORAL_CAPSULE | Freq: Two times a day (BID) | ORAL | Status: DC
  Administered 2017-04-08 – 2017-04-09 (×2): 200 mg via ORAL
  Filled 2017-04-08 (×2): qty 1

## 2017-04-08 MED ORDER — FLUOXETINE HCL 20 MG PO CAPS
20.0000 mg | ORAL_CAPSULE | Freq: Every day | ORAL | Status: DC
  Administered 2017-04-09: 20 mg via ORAL
  Filled 2017-04-08: qty 1

## 2017-04-08 MED ORDER — MORPHINE SULFATE (PF) 2 MG/ML IV SOLN: 1.0000 mg | INTRAVENOUS | Status: DC | PRN

## 2017-04-08 MED ORDER — HYDROCODONE-ACETAMINOPHEN 5-325 MG PO TABS
1.0000 | ORAL_TABLET | Freq: Two times a day (BID) | ORAL | Status: DC
  Administered 2017-04-08 – 2017-04-09 (×2): 1 via ORAL
  Filled 2017-04-08 (×2): qty 1

## 2017-04-08 MED ORDER — CARVEDILOL 3.125 MG PO TABS
3.1250 mg | ORAL_TABLET | Freq: Two times a day (BID) | ORAL | Status: DC
  Administered 2017-04-08 – 2017-04-09 (×2): 3.125 mg via ORAL
  Filled 2017-04-08 (×2): qty 1

## 2017-04-08 MED ORDER — METHOCARBAMOL 500 MG PO TABS: 500.0000 mg | ORAL_TABLET | Freq: Four times a day (QID) | ORAL | Status: DC | PRN

## 2017-04-08 MED ORDER — SUGAMMADEX SODIUM 200 MG/2ML IV SOLN
INTRAVENOUS | Status: DC | PRN
  Administered 2017-04-08: 180 mg via INTRAVENOUS

## 2017-04-08 MED ORDER — GUAIFENESIN 100 MG/5ML PO SYRP
200.0000 mg | ORAL_SOLUTION | ORAL | Status: DC | PRN
  Filled 2017-04-08: qty 10

## 2017-04-08 MED ORDER — CARBAMIDE PEROXIDE 6.5 % OT SOLN
5.0000 [drp] | OTIC | Status: DC | PRN
  Filled 2017-04-08: qty 15

## 2017-04-08 MED ORDER — MONTELUKAST SODIUM 10 MG PO TABS
10.0000 mg | ORAL_TABLET | Freq: Every day | ORAL | Status: DC
  Administered 2017-04-08: 10 mg via ORAL
  Filled 2017-04-08: qty 1

## 2017-04-08 MED ORDER — CHLORHEXIDINE GLUCONATE CLOTH 2 % EX PADS
6.0000 | MEDICATED_PAD | Freq: Once | CUTANEOUS | Status: AC
  Administered 2017-04-08: 6 via TOPICAL

## 2017-04-08 MED ORDER — FUROSEMIDE 40 MG PO TABS
80.0000 mg | ORAL_TABLET | Freq: Every day | ORAL | Status: DC
  Administered 2017-04-08 – 2017-04-09 (×2): 80 mg via ORAL
  Filled 2017-04-08 (×2): qty 2

## 2017-04-08 MED ORDER — AMMONIUM LACTATE 12 % EX LOTN
1.0000 "application " | TOPICAL_LOTION | Freq: Two times a day (BID) | CUTANEOUS | Status: DC
  Administered 2017-04-08 – 2017-04-09 (×2): 1 via TOPICAL
  Filled 2017-04-08: qty 400

## 2017-04-08 MED ORDER — ACETAMINOPHEN 325 MG PO TABS: 650.0000 mg | ORAL_TABLET | Freq: Four times a day (QID) | ORAL | Status: DC | PRN

## 2017-04-08 MED ORDER — ONDANSETRON HCL 4 MG/2ML IJ SOLN
INTRAMUSCULAR | Status: DC | PRN
  Administered 2017-04-08: 4 mg via INTRAVENOUS

## 2017-04-08 MED ORDER — BUPIVACAINE-EPINEPHRINE (PF) 0.5% -1:200000 IJ SOLN
INTRAMUSCULAR | Status: DC | PRN
  Administered 2017-04-08: 30 mL via PERINEURAL

## 2017-04-08 MED ORDER — ALUM & MAG HYDROXIDE-SIMETH 200-200-20 MG/5ML PO SUSP: 30.0000 mL | ORAL | Status: DC | PRN

## 2017-04-08 MED ORDER — BUPIVACAINE-EPINEPHRINE (PF) 0.5% -1:200000 IJ SOLN
INTRAMUSCULAR | Status: AC
  Filled 2017-04-08: qty 60

## 2017-04-08 MED ORDER — LORATADINE 10 MG PO TABS: 10.0000 mg | ORAL_TABLET | Freq: Every day | ORAL | Status: DC

## 2017-04-08 MED ORDER — PANTOPRAZOLE SODIUM 40 MG PO TBEC
40.0000 mg | DELAYED_RELEASE_TABLET | Freq: Every day | ORAL | Status: DC
  Administered 2017-04-08: 40 mg via ORAL
  Filled 2017-04-08: qty 1

## 2017-04-08 MED ORDER — KETOCONAZOLE 2 % EX SHAM: 1.0000 "application " | MEDICATED_SHAMPOO | CUTANEOUS | Status: DC

## 2017-04-08 MED ORDER — METHOCARBAMOL 1000 MG/10ML IJ SOLN
500.0000 mg | Freq: Four times a day (QID) | INTRAMUSCULAR | Status: DC | PRN
  Filled 2017-04-08: qty 5

## 2017-04-08 MED ORDER — LORAZEPAM 0.5 MG PO TABS
0.2500 mg | ORAL_TABLET | Freq: Every day | ORAL | Status: DC
  Administered 2017-04-08 – 2017-04-09 (×2): 0.25 mg via ORAL
  Filled 2017-04-08 (×2): qty 1

## 2017-04-08 MED ORDER — METOCLOPRAMIDE HCL 5 MG/ML IJ SOLN
5.0000 mg | Freq: Three times a day (TID) | INTRAMUSCULAR | Status: DC | PRN
Start: 2017-04-08 — End: 2017-04-09

## 2017-04-08 MED ORDER — NEOMYCIN-POLYMYXIN-DEXAMETH 0.1 % OP OINT
1.0000 "application " | TOPICAL_OINTMENT | Freq: Every day | OPHTHALMIC | Status: DC
  Administered 2017-04-08: 1 via OPHTHALMIC
  Filled 2017-04-08: qty 3.5

## 2017-04-08 MED ORDER — TRAMADOL HCL 50 MG PO TABS
50.0000 mg | ORAL_TABLET | Freq: Three times a day (TID) | ORAL | Status: DC
  Administered 2017-04-08 – 2017-04-09 (×3): 50 mg via ORAL
  Filled 2017-04-08 (×3): qty 1

## 2017-04-08 MED ORDER — SENNOSIDES-DOCUSATE SODIUM 8.6-50 MG PO TABS
1.0000 | ORAL_TABLET | Freq: Every day | ORAL | Status: DC
  Administered 2017-04-08 – 2017-04-09 (×2): 1 via ORAL
  Filled 2017-04-08: qty 1

## 2017-04-08 MED ORDER — LIDOCAINE HCL (CARDIAC) 20 MG/ML IV SOLN
INTRAVENOUS | Status: DC | PRN
  Administered 2017-04-08: 100 mg via INTRAVENOUS

## 2017-04-08 MED ORDER — CHLORHEXIDINE GLUCONATE CLOTH 2 % EX PADS: 6.0000 | MEDICATED_PAD | Freq: Once | CUTANEOUS | Status: DC

## 2017-04-08 MED ORDER — LIDOCAINE HCL (PF) 2 % IJ SOLN
INTRAMUSCULAR | Status: AC
  Filled 2017-04-08: qty 2

## 2017-04-08 MED ORDER — SUGAMMADEX SODIUM 200 MG/2ML IV SOLN
INTRAVENOUS | Status: AC
  Filled 2017-04-08: qty 2

## 2017-04-08 MED ORDER — ONDANSETRON HCL 4 MG/2ML IJ SOLN: 4.0000 mg | Freq: Four times a day (QID) | INTRAMUSCULAR | Status: DC | PRN

## 2017-04-08 MED ORDER — HYDROCERIN EX CREA
1.0000 | TOPICAL_CREAM | CUTANEOUS | Status: DC | PRN
Start: 2017-04-08 — End: 2017-04-09
  Filled 2017-04-08: qty 113

## 2017-04-08 MED ORDER — NUTRITIONAL SUPPLEMENT PO LIQD: 120.0000 mL | Freq: Three times a day (TID) | ORAL | Status: DC

## 2017-04-08 MED ORDER — ONDANSETRON HCL 4 MG PO TABS: 4.0000 mg | ORAL_TABLET | Freq: Three times a day (TID) | ORAL | Status: DC | PRN

## 2017-04-08 MED ORDER — PROPOFOL 10 MG/ML IV BOLUS
INTRAVENOUS | Status: DC | PRN
  Administered 2017-04-08: 100 mg via INTRAVENOUS

## 2017-04-08 MED ORDER — LORATADINE 10 MG PO TABS
10.0000 mg | ORAL_TABLET | Freq: Every day | ORAL | Status: DC
  Administered 2017-04-09: 10 mg via ORAL
  Filled 2017-04-08: qty 1

## 2017-04-08 MED ORDER — ROCURONIUM BROMIDE 50 MG/5ML IV SOLN
INTRAVENOUS | Status: AC
  Filled 2017-04-08: qty 1

## 2017-04-08 MED ORDER — ENOXAPARIN SODIUM 30 MG/0.3ML ~~LOC~~ SOLN
30.0000 mg | SUBCUTANEOUS | Status: DC
  Administered 2017-04-09: 30 mg via SUBCUTANEOUS
  Filled 2017-04-08: qty 0.3

## 2017-04-08 MED ORDER — ONDANSETRON HCL 4 MG PO TABS: 4.0000 mg | ORAL_TABLET | Freq: Four times a day (QID) | ORAL | Status: DC | PRN

## 2017-04-08 MED ORDER — CEFAZOLIN SODIUM-DEXTROSE 2-4 GM/100ML-% IV SOLN
2.0000 g | INTRAVENOUS | Status: AC
  Administered 2017-04-08: 2 g via INTRAVENOUS

## 2017-04-08 MED ORDER — THEREMS PO TABS: 1.0000 | ORAL_TABLET | Freq: Every day | ORAL | Status: DC

## 2017-04-08 MED ORDER — VITAMIN D (ERGOCALCIFEROL) 1.25 MG (50000 UNIT) PO CAPS: 50000.0000 [IU] | ORAL_CAPSULE | ORAL | Status: DC

## 2017-04-08 MED ORDER — FENTANYL CITRATE (PF) 100 MCG/2ML IJ SOLN
INTRAMUSCULAR | Status: DC | PRN
  Administered 2017-04-08: 50 ug via INTRAVENOUS
  Administered 2017-04-08: 100 ug via INTRAVENOUS

## 2017-04-08 MED ORDER — NEOMYCIN-POLYMYXIN B GU 40-200000 IR SOLN
Status: AC
  Filled 2017-04-08: qty 4

## 2017-04-08 MED ORDER — CLINDAMYCIN PHOSPHATE 600 MG/50ML IV SOLN
INTRAVENOUS | Status: DC | PRN
  Administered 2017-04-08: 600 mg via INTRAVENOUS

## 2017-04-08 MED ORDER — CLINDAMYCIN PHOSPHATE 600 MG/50ML IV SOLN
600.0000 mg | Freq: Three times a day (TID) | INTRAVENOUS | Status: DC
  Administered 2017-04-08 – 2017-04-09 (×2): 600 mg via INTRAVENOUS
  Filled 2017-04-08 (×3): qty 50

## 2017-04-08 MED ORDER — ONDANSETRON HCL 4 MG/2ML IJ SOLN: 4.0000 mg | Freq: Once | INTRAMUSCULAR | Status: DC | PRN

## 2017-04-08 MED ORDER — EPINEPHRINE PF 1 MG/ML IJ SOLN
INTRAMUSCULAR | Status: AC
  Filled 2017-04-08: qty 1

## 2017-04-08 MED ORDER — ACETAMINOPHEN 500 MG PO TABS
500.0000 mg | ORAL_TABLET | Freq: Three times a day (TID) | ORAL | Status: DC
  Administered 2017-04-08 – 2017-04-09 (×3): 500 mg via ORAL
  Filled 2017-04-08 (×3): qty 1

## 2017-04-08 MED ORDER — MAGNESIUM HYDROXIDE 400 MG/5ML PO SUSP: 30.0000 mL | Freq: Every day | ORAL | Status: DC | PRN

## 2017-04-08 MED ORDER — ADULT MULTIVITAMIN W/MINERALS CH
1.0000 | ORAL_TABLET | Freq: Every day | ORAL | Status: DC
  Administered 2017-04-09: 1 via ORAL
  Filled 2017-04-08: qty 1

## 2017-04-08 MED ORDER — FLEET ENEMA 7-19 GM/118ML RE ENEM: 1.0000 | ENEMA | Freq: Once | RECTAL | Status: DC | PRN

## 2017-04-08 MED ORDER — HYDROCODONE-ACETAMINOPHEN 7.5-325 MG PO TABS
1.0000 | ORAL_TABLET | ORAL | Status: DC | PRN
Start: 2017-04-08 — End: 2017-04-09

## 2017-04-08 MED ORDER — FENTANYL CITRATE (PF) 100 MCG/2ML IJ SOLN
INTRAMUSCULAR | Status: AC
  Filled 2017-04-08: qty 2

## 2017-04-08 MED ORDER — PHENYLEPHRINE HCL 10 MG/ML IJ SOLN
INTRAVENOUS | Status: DC | PRN
  Administered 2017-04-08: 15 ug/min via INTRAVENOUS

## 2017-04-08 MED ORDER — LOSARTAN POTASSIUM 25 MG PO TABS
25.0000 mg | ORAL_TABLET | Freq: Every day | ORAL | Status: DC
  Administered 2017-04-08 – 2017-04-09 (×2): 25 mg via ORAL
  Filled 2017-04-08 (×2): qty 1

## 2017-04-08 MED ORDER — BISACODYL 10 MG RE SUPP: 10.0000 mg | Freq: Every day | RECTAL | Status: DC | PRN

## 2017-04-08 MED ORDER — FENTANYL CITRATE (PF) 100 MCG/2ML IJ SOLN: 25.0000 ug | INTRAMUSCULAR | Status: DC | PRN

## 2017-04-08 MED ORDER — ALBUTEROL SULFATE (2.5 MG/3ML) 0.083% IN NEBU: 2.5000 mg | INHALATION_SOLUTION | RESPIRATORY_TRACT | Status: DC | PRN

## 2017-04-08 MED ORDER — ASPIRIN 81 MG PO CHEW
81.0000 mg | CHEWABLE_TABLET | Freq: Every day | ORAL | Status: DC
Start: 2017-04-08 — End: 2017-04-09
  Administered 2017-04-09: 81 mg via ORAL
  Filled 2017-04-08: qty 1

## 2017-04-08 MED ORDER — SODIUM CHLORIDE 0.9 % IR SOLN
Status: DC | PRN
  Administered 2017-04-08: 200 mL

## 2017-04-08 MED ORDER — METOCLOPRAMIDE HCL 10 MG PO TABS
5.0000 mg | ORAL_TABLET | Freq: Three times a day (TID) | ORAL | Status: DC | PRN
Start: 2017-04-08 — End: 2017-04-09

## 2017-04-08 MED ORDER — FLUTICASONE PROPIONATE 50 MCG/ACT NA SUSP
2.0000 | Freq: Every day | NASAL | Status: DC
  Administered 2017-04-08 – 2017-04-09 (×2): 2 via NASAL
  Filled 2017-04-08: qty 16

## 2017-04-08 MED ORDER — DERMAGRAN EX AERO: INHALATION_SPRAY | Freq: Three times a day (TID) | CUTANEOUS | Status: DC

## 2017-04-08 MED ORDER — CEFAZOLIN SODIUM-DEXTROSE 2-4 GM/100ML-% IV SOLN
2.0000 g | Freq: Three times a day (TID) | INTRAVENOUS | Status: DC
  Administered 2017-04-08 – 2017-04-09 (×2): 2 g via INTRAVENOUS
  Filled 2017-04-08 (×3): qty 100

## 2017-04-08 MED ORDER — DEXAMETHASONE SODIUM PHOSPHATE 4 MG/ML IJ SOLN
INTRAMUSCULAR | Status: DC | PRN
  Administered 2017-04-08: 6 mg via INTRAVENOUS

## 2017-04-08 MED ORDER — ACETAMINOPHEN 650 MG RE SUPP: 650.0000 mg | Freq: Four times a day (QID) | RECTAL | Status: DC | PRN

## 2017-04-08 MED ORDER — ROCURONIUM BROMIDE 100 MG/10ML IV SOLN
INTRAVENOUS | Status: DC | PRN
  Administered 2017-04-08: 50 mg via INTRAVENOUS
  Administered 2017-04-08 (×2): 10 mg via INTRAVENOUS

## 2017-04-08 MED ORDER — SENNA 8.6 MG PO TABS
1.0000 | ORAL_TABLET | Freq: Two times a day (BID) | ORAL | Status: DC
Start: 2017-04-08 — End: 2017-04-09
  Administered 2017-04-08 – 2017-04-09 (×2): 8.6 mg via ORAL
  Filled 2017-04-08 (×3): qty 1

## 2017-04-08 MED ORDER — CLINDAMYCIN PHOSPHATE 600 MG/50ML IV SOLN
INTRAVENOUS | Status: AC
  Filled 2017-04-08: qty 50

## 2017-04-08 MED ORDER — SIMVASTATIN 20 MG PO TABS
40.0000 mg | ORAL_TABLET | Freq: Every day | ORAL | Status: DC
  Administered 2017-04-08: 40 mg via ORAL
  Filled 2017-04-08: qty 2

## 2017-04-08 SURGICAL SUPPLY — 59 items
BAG COUNTER SPONGE EZ (MISCELLANEOUS) ×2 IMPLANT
BIT DRILL 2.9 SHORT NS (BIT) ×1
BIT DRILL 2.9MM SHORT NS (BIT) ×1 IMPLANT
BIT DRILL 3.8X6 NS (BIT) ×3 IMPLANT
CANISTER SUCT 1200ML W/VALVE (MISCELLANEOUS) ×6 IMPLANT
CHLORAPREP W/TINT 26ML (MISCELLANEOUS) ×3 IMPLANT
COOLER POLAR GLACIER W/PUMP (MISCELLANEOUS) ×3 IMPLANT
COUNTER SPONGE BAG EZ (MISCELLANEOUS) ×1
CRADLE LAMINECT ARM (MISCELLANEOUS) ×3 IMPLANT
DECANTER SPIKE VIAL GLASS SM (MISCELLANEOUS) ×6 IMPLANT
DRAPE C-ARM XRAY 36X54 (DRAPES) ×6 IMPLANT
DRAPE IMP U-DRAPE 54X76 (DRAPES) ×3 IMPLANT
DRILL BIT 2.9MM SHORT NS (BIT) ×2
DRSG OPSITE POSTOP 4X6 (GAUZE/BANDAGES/DRESSINGS) ×9 IMPLANT
ELECT REM PT RETURN 9FT ADLT (ELECTROSURGICAL) ×3
ELECTRODE REM PT RTRN 9FT ADLT (ELECTROSURGICAL) ×1 IMPLANT
GAUZE SPONGE 4X4 12PLY STRL (GAUZE/BANDAGES/DRESSINGS) ×3 IMPLANT
GLOVE BIO SURGEON STRL SZ7.5 (GLOVE) ×3 IMPLANT
GLOVE INDICATOR 8.0 STRL GRN (GLOVE) ×3 IMPLANT
GOWN STRL REUS W/ TWL LRG LVL3 (GOWN DISPOSABLE) ×1 IMPLANT
GOWN STRL REUS W/TWL LRG LVL3 (GOWN DISPOSABLE) ×2
GOWN STRL REUS W/TWL LRG LVL4 (GOWN DISPOSABLE) ×3 IMPLANT
GUIDEWIRE BALL NOSE 2.0MM (WIRE) ×3 IMPLANT
GUIDEWIRE HUMERAL 2.2MMX711MM (WIRE) ×3 IMPLANT
HEMOVAC 400ML (MISCELLANEOUS)
KIT DRAIN HEMOVAC JP 7FR 400ML (MISCELLANEOUS) IMPLANT
KIT RM TURNOVER STRD PROC AR (KITS) ×3 IMPLANT
MASK FACE SPIDER DISP (MASK) ×3 IMPLANT
NAIL HUMERUS UNIVERSAL 8X240MM (Trauma) ×1 IMPLANT
NDL SAFETY ECLIPSE 18X1.5 (NEEDLE) ×1 IMPLANT
NEEDLE FILTER BLUNT 18X 1/2SAF (NEEDLE) ×2
NEEDLE FILTER BLUNT 18X1 1/2 (NEEDLE) ×1 IMPLANT
NEEDLE HYPO 18GX1.5 SHARP (NEEDLE) ×2
NEEDLE HYPO 22GX1.5 SAFETY (NEEDLE) ×3 IMPLANT
NS IRRIG 1000ML POUR BTL (IV SOLUTION) ×3 IMPLANT
PACK ARTHROSCOPY SHOULDER (MISCELLANEOUS) ×3 IMPLANT
PAD WRAPON POLAR SHDR UNIV (MISCELLANEOUS) ×1 IMPLANT
PENCIL ELECTRO HAND CTR (MISCELLANEOUS) ×3 IMPLANT
PIN GUIDE ACE (PIN) ×3 IMPLANT
SCREW 4.8MM CANC NONSTRL 36MM (Screw) ×3 IMPLANT
SCREW CORT FT 32X3.5XNS HUM (Screw) ×1 IMPLANT
SCREW CORTICAL 3.5MM 32MM (Screw) ×2 IMPLANT
SCREWDRIVER HEX TIP 3.5MM (MISCELLANEOUS) ×3 IMPLANT
SLING ARM LRG DEEP (SOFTGOODS) ×3 IMPLANT
SOL PREP PVP 2OZ (MISCELLANEOUS) ×3
SOLUTION PREP PVP 2OZ (MISCELLANEOUS) ×1 IMPLANT
STAPLER SKIN PROX 35W (STAPLE) ×3 IMPLANT
STRAP SAFETY BODY (MISCELLANEOUS) ×3 IMPLANT
SUT PROLENE 3 0 PS 2 (SUTURE) ×6 IMPLANT
SUT VIC AB 0 CT1 36 (SUTURE) ×3 IMPLANT
SUT VIC AB 2-0 CT1 27 (SUTURE) ×6
SUT VIC AB 2-0 CT1 TAPERPNT 27 (SUTURE) ×3 IMPLANT
SUT VIC AB 2-0 CT2 27 (SUTURE) ×3 IMPLANT
SYR 30ML LL (SYRINGE) ×3 IMPLANT
SYR 5ML LL (SYRINGE) ×3 IMPLANT
TAPE TRANSPORE STRL 2 31045 (GAUZE/BANDAGES/DRESSINGS) ×3 IMPLANT
TRAY FOLEY CATH SILVER 16FR LF (SET/KITS/TRAYS/PACK) ×3 IMPLANT
UNIVERSAL HUM NAIL 8X240MM (Trauma) ×3 IMPLANT
WRAPON POLAR PAD SHDR UNIV (MISCELLANEOUS) ×3

## 2017-04-08 NOTE — Op Note (Signed)
04/08/2017  6:35 PM  PATIENT:  Allison Quinn    PRE-OPERATIVE DIAGNOSIS:  B35.329J Pathological fracture, left humerus, init for fx  POST-OPERATIVE DIAGNOSIS:  Same  PROCEDURE:  INTRAMEDULLARY (IM) NAIL HUMERAL LEFT HUMERUS  SURGEON:  Park Breed, MD  ANESTHESIA:   General Endotracheal  PREOPERATIVE INDICATIONS:  Allison Quinn is a  83 y.o. female with a diagnosis of M84.422A Pathological fracture, left humerus, init for fx who failed conservative measures and elected for surgical management.    The risks benefits and alternatives were discussed with the patient preoperatively including but not limited to the risks of infection, bleeding, nerve injury, cardiopulmonary complications, the need for revision surgery, among others, and the patient was willing to proceed.  EBL: 150 cc  TOURNIQUET TIME:  None  OPERATIVE IMPLANTS: 63mm x 2105mm  Biomet humeral Versanail, 2 screws  OPERATIVE FINDINGS: Patholgic midshaft fracture left humerus  SPECIMEN:  Marrow aspirates of the fracture site       OPERATIVE PROCEDURE:   The patient was brought to the operating room and underwent satisfactory general endotracheal anesthesia in the supine position. The bed was repositioned to the beachchair position. The operative arm was prepped and draped in a sterile fashion. 1/2% Sensorcaine with epinephrine was placed in the proximal and distal incisional areas. A short saber incision was made off the edge of the acromion and dissection carried out bluntly through subcutaneous tissue. Muscle was split bluntly at the anterior edge of the acromium and bursectomy carried out. Due to the patient's severe shoulder joint arthritis and humeral head collapse, an incision was made lateral to  the supraspinatus cuff tissue and a guidepin inserted. The shoulder joint had virtually no movement, making the approach very difficult and more lateral than usual.  Fluoroscopy showed the guide pin to  be in satisfactory position with a good entry point. A large awl was used to enlarge the opening in the humerus. The ball tip guide was advanced down the humerus to pass across the fracture. This was then replaced with a smooth guide down to the distal aspect of the humerus.Suction aspiration was carried out down the shaft to obtain pathological specimens.  Fluoroscopy showed the guide pin to be in good position. Length was measured. The reamers were introduced and the canal reamed to 73mm over the nail size by hand.  The above named Allison Quinn was then introduced and seated completely and slightly countersunk. A  screw was introduced under direct vision since the rod was so lateralized,  lateral to medial fixing the rod proximally. The distal hole in the nail was located by skin markers and a small incision made over this. Blunt dissection was carried out through muscle down to bone. Using fluoroscopy, the hole was drilled and a distal screw was introduced and seated snugly. Fluoroscopy showed the hardware to be in good position and the fracture be well aligned. The wounds were irrigated and the rotator cuff closed with 3-0 Mersilene suture. Subcutaneous tissues were closed with 0 and 2-0 Vicryl. Remainder of the Sensorcaine was injected. Dry sterile dressings and a sling were applied. Sponge and needle counts were correct. Patient was awakened and taken to recovery in good condition.   Park Breed, M.D.

## 2017-04-08 NOTE — Anesthesia Procedure Notes (Signed)
Procedure Name: Intubation Date/Time: 04/08/2017 12:24 PM Performed by: Rosaria Ferries, Arlean Thies Pre-anesthesia Checklist: Patient identified, Emergency Drugs available, Suction available and Patient being monitored Patient Re-evaluated:Patient Re-evaluated prior to inductionOxygen Delivery Method: Circle system utilized Preoxygenation: Pre-oxygenation with 100% oxygen Intubation Type: IV induction Laryngoscope Size: Mac and 3 Grade View: Grade I Tube size: 7.0 mm Number of attempts: 1 Airway Equipment and Method: Stylet Placement Confirmation: ETT inserted through vocal cords under direct vision,  positive ETCO2 and breath sounds checked- equal and bilateral Secured at: 21 cm Tube secured with: Tape Dental Injury: Teeth and Oropharynx as per pre-operative assessment

## 2017-04-08 NOTE — Transfer of Care (Signed)
Immediate Anesthesia Transfer of Care Note  Patient: Allison Quinn  Procedure(s) Performed: Procedure(s): INTRAMEDULLARY (IM) NAIL HUMERAL (Left)  Patient Location: PACU  Anesthesia Type:General  Level of Consciousness: patient cooperative  Airway & Oxygen Therapy: Patient Spontanous Breathing and Patient connected to face mask oxygen  Post-op Assessment: Report given to RN and Post -op Vital signs reviewed and stable  Post vital signs: Reviewed and stable  Last Vitals:  Vitals:   04/08/17 1004 04/08/17 1520  BP: 137/72 112/60  Pulse: 87 72  Resp: 18 16  Temp: 36.8 C 36.6 C    Last Pain:  Vitals:   04/08/17 1004  TempSrc: Tympanic         Complications: No apparent anesthesia complications

## 2017-04-08 NOTE — Anesthesia Preprocedure Evaluation (Signed)
Anesthesia Evaluation  Patient identified by MRN, date of birth, ID band Patient awake    Airway Mallampati: III       Dental   Pulmonary  COPD inhaler,  S/p pneumonia about 1 year ago. Still on inhalers. S/p PE as well.          Cardiovascular hypertension, Pt. on medications and Pt. on home beta blockers + CAD    Flattened t-waves laterally on EKG.   Neuro/Psych Anxiety Depression CVA (R sided weakness), Residual Symptoms    GI/Hepatic Neg liver ROS, GERD  Medicated and Poorly Controlled,  Endo/Other  negative endocrine ROS  Renal/GU negative Renal ROS     Musculoskeletal   Abdominal   Peds  Hematology  (+) anemia ,   Anesthesia Other Findings   Reproductive/Obstetrics                            Anesthesia Physical Anesthesia Plan  ASA: IV  Anesthesia Plan: General   Post-op Pain Management:    Induction: Intravenous  Airway Management Planned: Oral ETT  Additional Equipment:   Intra-op Plan:   Post-operative Plan:   Informed Consent: I have reviewed the patients History and Physical, chart, labs and discussed the procedure including the risks, benefits and alternatives for the proposed anesthesia with the patient or authorized representative who has indicated his/her understanding and acceptance.     Plan Discussed with:   Anesthesia Plan Comments:         Anesthesia Quick Evaluation

## 2017-04-08 NOTE — Anesthesia Postprocedure Evaluation (Signed)
Anesthesia Post Note  Patient: Allison Quinn  Procedure(s) Performed: Procedure(s) (LRB): INTRAMEDULLARY (IM) NAIL HUMERAL (Left)  Patient location during evaluation: PACU Anesthesia Type: General Level of consciousness: awake and alert Pain management: pain level controlled Vital Signs Assessment: post-procedure vital signs reviewed and stable Respiratory status: spontaneous breathing and respiratory function stable Cardiovascular status: stable Anesthetic complications: no     Last Vitals:  Vitals:   04/08/17 1004 04/08/17 1520  BP: 137/72 112/60  Pulse: 87 72  Resp: 18 16  Temp: 36.8 C 36.6 C    Last Pain:  Vitals:   04/08/17 1004  TempSrc: Tympanic                 Cyncere Sontag K

## 2017-04-08 NOTE — Anesthesia Post-op Follow-up Note (Cosign Needed)
Anesthesia QCDR form completed.        

## 2017-04-08 NOTE — Progress Notes (Signed)
Patient was admitted to room 144 from OR via room bed. NSL, foley in place. Left arm in sling. Two honeycomb dressing in place with small drainage. Patient is A&O x3, HOH. Daughter at bedside. Patient is wheelchair bound and using a lift for transfers at [facility]. Weakness to right side from past CVA. Bed alarm on for safety.

## 2017-04-08 NOTE — H&P (Signed)
THE PATIENT WAS SEEN PRIOR TO SURGERY TODAY.  HISTORY, ALLERGIES, HOME MEDICATIONS AND OPERATIVE PROCEDURE WERE REVIEWED. RISKS AND BENEFITS OF SURGERY DISCUSSED WITH PATIENT AGAIN.  NO CHANGES FROM INITIAL HISTORY AND PHYSICAL NOTED.    

## 2017-04-09 ENCOUNTER — Encounter: Payer: Self-pay | Admitting: Specialist

## 2017-04-09 DIAGNOSIS — M84422A Pathological fracture, left humerus, initial encounter for fracture: Secondary | ICD-10-CM | POA: Diagnosis not present

## 2017-04-09 LAB — BASIC METABOLIC PANEL
Anion gap: 9 (ref 5–15)
BUN: 30 mg/dL — AB (ref 6–20)
CHLORIDE: 97 mmol/L — AB (ref 101–111)
CO2: 26 mmol/L (ref 22–32)
Calcium: 8.5 mg/dL — ABNORMAL LOW (ref 8.9–10.3)
Creatinine, Ser: 1.36 mg/dL — ABNORMAL HIGH (ref 0.44–1.00)
GFR calc Af Amer: 39 mL/min — ABNORMAL LOW (ref >60)
GFR calc non Af Amer: 33 mL/min — ABNORMAL LOW (ref >60)
GLUCOSE: 196 mg/dL — AB (ref 65–99)
POTASSIUM: 4.1 mmol/L (ref 3.5–5.1)
Sodium: 132 mmol/L — ABNORMAL LOW (ref 135–145)

## 2017-04-09 LAB — CBC
HEMATOCRIT: 34.2 % — AB (ref 35.0–47.0)
HEMOGLOBIN: 11.4 g/dL — AB (ref 12.0–16.0)
MCH: 30.8 pg (ref 26.0–34.0)
MCHC: 33.3 g/dL (ref 32.0–36.0)
MCV: 92.7 fL (ref 80.0–100.0)
Platelets: 220 10*3/uL (ref 150–440)
RBC: 3.7 MIL/uL — AB (ref 3.80–5.20)
RDW: 14.9 % — ABNORMAL HIGH (ref 11.5–14.5)
WBC: 11.9 10*3/uL — ABNORMAL HIGH (ref 3.6–11.0)

## 2017-04-09 MED ORDER — HYDROCODONE-ACETAMINOPHEN 5-325 MG PO TABS: 1.0000 | ORAL_TABLET | Freq: Four times a day (QID) | ORAL | 0 refills | Status: AC | PRN

## 2017-04-09 MED ORDER — ASPIRIN EC 325 MG PO TBEC: 325.0000 mg | DELAYED_RELEASE_TABLET | Freq: Every day | ORAL | 0 refills | Status: AC

## 2017-04-09 NOTE — Progress Notes (Signed)
Patient is medically stable for D/C back to [facility] today. Per Seth Bake admissions coordinator at [facility] patient can come today to room 267. RN will call report at [phone] and arrange EMS for transport. Clinical Education officer, museum (CSW) sent D/C orders to [facility] via [place]. Patient is aware of above. Patient's daughter Jamice is at bedside and aware of above. Please reconsult if future social work needs arise. CSW signing off.   [employer], LCSW [phone]

## 2017-04-09 NOTE — NC FL2 (Signed)
[place] LEVEL OF CARE SCREENING TOOL     IDENTIFICATION  Patient Name: Allison Quinn Birthdate: 10-18-1926 Sex: female Admission Date (Current Location): 04/08/2017  [place] and [place] Number:  Engineering geologist and Address:  [facility], [address] [place], [place] [address]      Provider Number: [id number]  Attending Physician Name and Address:  Earnestine Leys, MD  Relative Name and Phone Number:       Current Level of Care: Hospital Recommended Level of Care: [facility] Prior Approval Number:    Date Approved/Denied:   PASRR Number: [id number]  Discharge Plan: SNF    Current Diagnoses: Patient Active Problem List   Diagnosis Date Noted  . Displaced comminuted fracture of shaft of humerus, left arm, initial encounter for closed fracture 04/08/2017  . Bone metastasis ([place]) 03/22/2017    Orientation RESPIRATION BLADDER Height & Weight     Self, Time, Situation, Place  Normal Continent, Indwelling catheter Weight: 176 lb 5.9 oz (80 kg) Height:  5\' 2"  (157.5 cm)  BEHAVIORAL SYMPTOMS/MOOD NEUROLOGICAL BOWEL NUTRITION STATUS   (NONE)  (NONE) Continent Diet (Regular; Thin liquid consistency)  AMBULATORY STATUS COMMUNICATION OF NEEDS Skin   Extensive Assist (Wheelchair bound; lifts for transfer) Verbally Surgical wounds (closed left arm incision- honeycomb dressing)                       Personal Care Assistance Level of Assistance  Bathing, Feeding, Dressing Bathing Assistance: Maximum assistance Feeding assistance: Limited assistance Dressing Assistance: Maximum assistance     Functional Limitations Info  Sight, Hearing, Speech Sight Info: Adequate Hearing Info: Impaired (Hearing Aids) Speech Info: Adequate    SPECIAL CARE FACTORS FREQUENCY  PT (By licensed PT)     PT Frequency: min 5x/week              Contractures Contractures Info: Not present    Additional Factors  Info  Code Status, Allergies Code Status Info: FULL Allergies Info: Adhesive Tape, Codeine, Lincocin Lincomycin Hcl, Penicillins, Tetracyclines & Related           Current Medications (04/09/2017):  This is the current hospital active medication list Current Facility-Administered Medications  Medication Dose Route Frequency Provider Last Rate Last Dose  . 0.45 % sodium chloride infusion   Intravenous Continuous Earnestine Leys, MD 75 mL/hr at 04/08/17 1803    . acetaminophen (TYLENOL) tablet 650 mg  650 mg Oral Q6H PRN Earnestine Leys, MD       Or  . acetaminophen (TYLENOL) suppository 650 mg  650 mg Rectal Q6H PRN Earnestine Leys, MD      . acetaminophen (TYLENOL) tablet 500 mg  500 mg Oral Lajuan Lines, MD   500 mg at 04/09/17 [phone]  . albuterol (PROVENTIL) (2.5 MG/3ML) 0.083% nebulizer solution 2.5 mg  2.5 mg Nebulization Q4H PRN Earnestine Leys, MD      . alum & mag hydroxide-simeth (MAALOX/MYLANTA) 200-200-20 MG/5ML suspension 30 mL  30 mL Oral Q4H PRN Earnestine Leys, MD      . ammonium lactate (LAC-HYDRIN) 12 % lotion 1 application  1 application Topical BID Earnestine Leys, MD   1 application at 31/51/76 [phone]  . aspirin chewable tablet 81 mg  81 mg Oral Daily Earnestine Leys, MD   81 mg at 04/09/17 3710  . bisacodyl (DULCOLAX) suppository 10 mg  10 mg Rectal Daily PRN Earnestine Leys, MD      . carbamide peroxide [facility])  6.5 % otic solution 5 drop  5 drop Both EARS PRN Earnestine Leys, MD      . carvedilol (COREG) tablet 3.125 mg  3.125 mg Oral BID Earnestine Leys, MD   3.125 mg at 04/09/17 1324  . ceFAZolin (ANCEF) IVPB 2g/100 mL premix  2 g Intravenous Q8H Earnestine Leys, MD   Stopped at 04/09/17 0930  . celecoxib (CELEBREX) capsule 200 mg  200 mg Oral Q12H Earnestine Leys, MD   200 mg at 04/09/17 4010  . clindamycin (CLEOCIN) IVPB 600 mg  600 mg Intravenous Lajuan Lines, MD   Stopped at 04/09/17 [phone]  . enoxaparin (LOVENOX) injection 30 mg  30 mg Subcutaneous Q24H Earnestine Leys,  MD   30 mg at 04/09/17 3664  . FLUoxetine (PROZAC) capsule 20 mg  20 mg Oral Daily Earnestine Leys, MD   20 mg at 04/09/17 4034  . fluticasone (FLONASE) 50 MCG/ACT nasal spray 2 spray  2 spray Each Nare Daily Earnestine Leys, MD   2 spray at 04/09/17 0930  . furosemide (LASIX) tablet 80 mg  80 mg Oral Daily Earnestine Leys, MD   80 mg at 04/09/17 7425  . guaifenesin (ROBITUSSIN) 100 MG/5ML syrup 200 mg  200 mg Oral Q4H PRN Earnestine Leys, MD      . hydrocerin (EUCERIN) cream 1 application  1 application Topical PRN Earnestine Leys, MD      . HYDROcodone-acetaminophen [facility]) 7.5-325 MG per tablet 1-2 tablet  1-2 tablet Oral Q4H PRN Earnestine Leys, MD      . HYDROcodone-acetaminophen (NORCO/VICODIN) 5-325 MG per tablet 1 tablet  1 tablet Oral BID Earnestine Leys, MD   1 tablet at 04/09/17 [phone]  . [START ON 04/13/2017] ketoconazole (NIZORAL) 2 % shampoo 1 application  1 application Topical Q Chana Bode, Nadara Mustard, MD      . loratadine (CLARITIN) tablet 10 mg  10 mg Oral Daily Earnestine Leys, MD   10 mg at 04/09/17 8756  . LORazepam (ATIVAN) tablet 0.25 mg  0.25 mg Oral Daily Earnestine Leys, MD   0.25 mg at 04/09/17 0926  . losartan (COZAAR) tablet 25 mg  25 mg Oral Daily Earnestine Leys, MD   25 mg at 04/09/17 4332  . magnesium hydroxide (MILK OF MAGNESIA) suspension 30 mL  30 mL Oral Daily PRN Earnestine Leys, MD      . methocarbamol (ROBAXIN) tablet 500 mg  500 mg Oral Q6H PRN Earnestine Leys, MD       Or  . methocarbamol (ROBAXIN) 500 mg in dextrose 5 % 50 mL IVPB  500 mg Intravenous Q6H PRN Earnestine Leys, MD      . metoCLOPramide (REGLAN) tablet 5-10 mg  5-10 mg Oral Q8H PRN Earnestine Leys, MD       Or  . metoCLOPramide (REGLAN) injection 5-10 mg  5-10 mg Intravenous Q8H PRN Earnestine Leys, MD      . montelukast (SINGULAIR) tablet 10 mg  10 mg Oral Tora Duck, MD   10 mg at 04/08/17 2109  . morphine 2 MG/ML injection 1 mg  1 mg Intravenous Q2H PRN Earnestine Leys, MD      . multivitamin with  minerals tablet 1 tablet  1 tablet Oral Daily Earnestine Leys, MD   1 tablet at 04/09/17 0925  . neomycin-polymyxin-dexameth (MAXITROL) 0.1 % ophth ointment 1 application  1 application Left Eye Tora Duck, MD   1 application at 95/18/84 2108  . ondansetron (ZOFRAN) tablet 4 mg  4 mg Oral Q6H PRN  Earnestine Leys, MD       Or  . ondansetron [facility] injection 4 mg  4 mg Intravenous Q6H PRN Earnestine Leys, MD      . pantoprazole (PROTONIX) EC tablet 40 mg  40 mg Oral Tora Duck, MD   40 mg at 04/08/17 2110  . polyvinyl alcohol (LIQUIFILM TEARS) 1.4 % ophthalmic solution 1 drop  1 drop Both Eyes PRN Earnestine Leys, MD      . senna [facility]) tablet 8.6 mg  1 tablet Oral BID Earnestine Leys, MD   8.6 mg at 04/09/17 0786  . senna-docusate (Senokot-S) tablet 1 tablet  1 tablet Oral Daily Earnestine Leys, MD   1 tablet at 04/09/17 [phone]  . simvastatin (ZOCOR) tablet 40 mg  40 mg Oral QHS Earnestine Leys, MD   40 mg at 04/08/17 2109  . sodium phosphate (FLEET) 7-19 GM/118ML enema 1 enema  1 enema Rectal Once PRN Earnestine Leys, MD      . traMADol Veatrice Bourbon) tablet 50 mg  50 mg Oral TID Earnestine Leys, MD   50 mg at 04/09/17 0925  . [START ON 05/05/2017] Vitamin D (Ergocalciferol) (DRISDOL) capsule 50,000 Units  50,000 Units Oral Q30 days Earnestine Leys, MD         Discharge Medications: Please see discharge summary for a list of discharge medications.  Relevant Imaging Results:  Relevant Lab Results:   Additional Information SS #[id number]  Lind Covert, LCSW

## 2017-04-09 NOTE — Progress Notes (Signed)
Patient was discharged back to [facility] via EMS. Report called. IV removed. Dressing changed. Sling in place. Daughter at bedside and aware. Script send in packet.

## 2017-04-09 NOTE — Evaluation (Signed)
Physical Therapy Evaluation Patient Details Name: Allison Quinn MRN: [id number] DOB: 07/02/1926 Today's Date: 04/09/2017   History of Present Illness  83 y/o female s/p L humeral ORIF, she had pathological fracture with lung mets and osteoporosis.  Pt had a CVA in 2012 effecting her R side and has been using a Hoyer lift since that time for transfers, she generally spends most of her time in a wheels chair but was ale to use L UE for eating, etc but is now in a sling and NWBing.  Clinical Impression  Pt is typically quite limited at baseline but is able to use L UE to eat and assist with ADLs, etc.  Pt now NWBing and in sling and is therefore even more limited.  She needs a Hoyerlift at baseline, but apparently she can use LEs to scoot around in her w/c and is able to stay somewhat active with this.  She had a CVA 6 years ago that effected her R side significantly, but she does still have some functional strength in U&LEs.  Pt will have a hard time trying any mobility until L UE WBing is allowed, but she did seem eager to work with ~10 minutes of LE exercises apart from the PT exam.     Follow Up Recommendations SNF    Equipment Recommendations       Recommendations for Other Services       Precautions / Restrictions Precautions Precautions: Sternal Required Braces or Orthoses: Sling Restrictions Weight Bearing Restrictions: Yes LUE Weight Bearing: Non weight bearing      Mobility  Bed Mobility               General bed mobility comments: deferred mobility as she requires Hoyer at baseline   Transfers                    Ambulation/Gait                Stairs            Wheelchair Mobility    Modified Rankin (Stroke Patients Only)       Balance                                             Pertinent Vitals/Pain Pain Assessment:  (unable to rate, pt indicated some L UE pain)    Home Living Family/patient expects to  be discharged to:: Skilled nursing facility                      Prior Function Level of Independence: Needs assistance         Comments: Pt is essentially w/c bound, Hoyer lift for transfers     Hand Dominance        Extremity/Trunk Assessment   Upper Extremity Assessment Upper Extremity Assessment: RUE deficits/detail;LUE deficits/detail RUE Deficits / Details: pt unable to lift shoulder more than 20 degrees, did have some AROM in elbow/wrist/hand but generally weak, grossly 2+ to 3-/5 LUE Deficits / Details: AROM grip/wrist/elbow but all within constraints of the sling    Lower Extremity Assessment Lower Extremity Assessment: Generalized weakness (R grossly 3-/5, L 3/5 - except limited ankle DF (P&AROM))       Communication   Communication: HOH;Expressive difficulties;Receptive difficulties  Cognition Arousal/Alertness: Awake/alert Behavior During Therapy:  (pleasantly confused) Overall Cognitive Status:  History of cognitive impairments - at baseline                                        General Comments      Exercises General Exercises - Lower Extremity Ankle Circles/Pumps: AROM;Strengthening;10 reps;Both (PROM for L ankle DF (unable to achieve neutral), limited ) Short Arc Quad: AROM;Strengthening;10 reps;Both Heel Slides: AAROM;5 reps;Both Hip ABduction/ADduction: AROM;10 reps;Both Straight Leg Raises: PROM;AAROM;5 reps;Both   Assessment/Plan    PT Assessment Patient needs continued PT services  PT Problem List Decreased strength;Decreased range of motion;Decreased activity tolerance;Decreased mobility;Decreased cognition;Decreased safety awareness;Pain       PT Treatment Interventions Functional mobility training;Therapeutic activities;Therapeutic exercise;Neuromuscular re-education;Cognitive remediation;Patient/family education;Wheelchair mobility training    PT Goals (Current goals can be found in the Care Plan section)   Acute Rehab PT Goals Patient Stated Goal: daughter hoping to build some strength at rehab PT Goal Formulation: With family Time For Goal Achievement: 04/23/17 Potential to Achieve Goals: Fair    Frequency 7X/week   Barriers to discharge        Co-evaluation               AM-PAC PT "6 Clicks" Daily Activity  Outcome Measure Difficulty turning over in bed (including adjusting bedclothes, sheets and blankets)?: Total Difficulty moving from lying on back to sitting on the side of the bed? : Total Difficulty sitting down on and standing up from a chair with arms (e.g., wheelchair, bedside commode, etc,.)?: Total Help needed moving to and from a bed to chair (including a wheelchair)?: Total Help needed walking in hospital room?: Total Help needed climbing 3-5 steps with a railing? : Total 6 Click Score: 6    End of Session   Activity Tolerance:  (limited due to confusion) Patient left: with bed alarm set;with call bell/phone within reach;with family/visitor present Nurse Communication: Mobility status PT Visit Diagnosis: Muscle weakness (generalized) (M62.81)    Time: 6825-7493 PT Time Calculation (min) (ACUTE ONLY): 21 min   Charges:   PT Evaluation $PT Eval Low Complexity: 1 Procedure PT Treatments $Therapeutic Exercise: 8-22 mins   PT G Codes:   PT G-Codes **NOT FOR INPATIENT CLASS** Functional Assessment Tool Used: AM-PAC 6 Clicks Basic Mobility Functional Limitation: Changing and maintaining body position Changing and Maintaining Body Position Current Status (X5217): 100 percent impaired, limited or restricted Changing and Maintaining Body Position Goal Status (G7159): At least 60 percent but less than 80 percent impaired, limited or restricted    Kreg Shropshire, DPT 04/09/2017, 11:30 AM

## 2017-04-09 NOTE — Discharge Summary (Signed)
Physician Discharge Summary  Patient ID: Allison Quinn MRN: [id number] DOB/AGE: 81/27/1927 81 y.o.  Admit date: 04/08/2017 Discharge date: 04/09/2017  Admission Diagnoses:  Discharge Diagnoses:  Active Problems:   Displaced comminuted fracture of shaft of humerus, left arm, initial encounter for closed fracture   Discharged Condition: good  Hospital Course: Underwent rodding of left humerus on 3/87/56 without complication.  Did well overnight with minimal pain, swelling, or blood loss.  CSM good today.  Passive ROM good.  Cbc good.  Will D/C back to [name] today and see in office in 4 days.  Continue sling and EC ASA.    Consults: None  Significant Diagnostic Studies: radiology: X-Ray: Satisfactory position of fracture and rod  Treatments: antibiotics: Ancef  Discharge Exam: Blood pressure 114/62, pulse 78, temperature 98 F (36.7 C), temperature source Oral, resp. rate 20, height 5\' 2"  (1.575 m), weight 80 kg (176 lb 5.9 oz), SpO2 93 %. Incision/Wound: scant drainage.  CSM good  Disposition:   Discharge Instructions    Call MD for:  persistant nausea and vomiting    Complete by:  As directed    Call MD for:  redness, tenderness, or signs of infection (pain, swelling, redness, odor or green/yellow discharge around incision site)    Complete by:  As directed    Call MD for:  severe uncontrolled pain    Complete by:  As directed    Call MD for:  temperature >100.4    Complete by:  As directed    Diet - low sodium heart healthy    Complete by:  As directed    Discharge instructions    Complete by:  As directed    Use sling left arm Change dressings as needed RTC Monday PT-- gentle passive range of motion left elbow and shoulder TID   Increase activity slowly    Complete by:  As directed      Allergies as of 04/09/2017      Reactions   Adhesive [tape]    Documented on MAR - Paper tape only   Codeine    Documented on MAR   Lincocin [lincomycin Hcl]     Documented on MAR   Penicillins    Documented on MAR   Tetracyclines & Related    Documented on MAR      Medication List    TAKE these medications   acetaminophen 325 MG tablet Commonly known as:  TYLENOL Take 650 mg by mouth 4 (four) times daily as needed for moderate pain.   acetaminophen 500 MG tablet Commonly known as:  TYLENOL Take 500 mg by mouth every 8 (eight) hours.   albuterol (2.5 MG/3ML) 0.083% nebulizer solution Commonly known as:  PROVENTIL Take 2.5 mg by nebulization every 4 (four) hours as needed for wheezing or shortness of breath.   alum & mag hydroxide-simeth 200-200-20 MG/5ML suspension Commonly known as:  MAALOX/MYLANTA Take 30 mLs by mouth every 4 (four) hours as needed for indigestion or flatulence.   ammonium lactate 12 % lotion Commonly known as:  LAC-HYDRIN Apply 1 application topically 2 (two) times daily. To arms and legs   ARTIFICIAL TEARS OP Place 1 drop into both eyes at bedtime. May instill 1 drop to both eyes 3 times daily as needed for irritation / dryness   aspirin 81 MG chewable tablet Chew 81 mg by mouth daily. What changed:  Another medication with the same name was added. Make sure you understand how and when to take  each.   aspirin EC 325 MG tablet Take 1 tablet (325 mg total) by mouth daily. What changed:  You were already taking a medication with the same name, and this prescription was added. Make sure you understand how and when to take each.   carbamide peroxide 6.5 % otic solution Commonly known as:  DEBROX Place 5 drops into both ears as needed (wax).   carvedilol 3.125 MG tablet Commonly known as:  COREG Take 3.125 mg by mouth 2 (two) times daily.   clindamycin 150 MG capsule Commonly known as:  CLEOCIN Take 4 capsules PO 1 hour prior to dental procedure   DERMAGRAN EX Apply 1 application topically 3 (three) times daily. Apply to buttocks for protection every shift   ergocalciferol 50000 units capsule Commonly  known as:  VITAMIN D2 Take 50,000 Units by mouth every 30 (thirty) days.   eucerin lotion Apply topically as needed for dry skin.   fexofenadine 180 MG tablet Commonly known as:  ALLEGRA Take 180 mg by mouth daily as needed for allergies or rhinitis.   FLUoxetine 20 MG capsule Commonly known as:  PROZAC Take 20 mg by mouth daily.   fluticasone 50 MCG/ACT nasal spray Commonly known as:  FLONASE Place 2 sprays into both nostrils daily.   furosemide 80 MG tablet Commonly known as:  LASIX Take 80 mg by mouth daily. May take an additional 80 mg as needed If weight is greater than 200, give at 1pm   guaifenesin 100 MG/5ML syrup Commonly known as:  ROBITUSSIN Take 200 mg by mouth every 4 (four) hours as needed for cough.   HYDROcodone-acetaminophen 5-325 MG tablet Commonly known as:  NORCO/VICODIN Take 1 tablet by mouth 2 (two) times daily. What changed:  Another medication with the same name was added. Make sure you understand how and when to take each.   HYDROcodone-acetaminophen 5-325 MG tablet Commonly known as:  NORCO Take 1 tablet by mouth every 6 (six) hours as needed. What changed:  You were already taking a medication with the same name, and this prescription was added. Make sure you understand how and when to take each.   ketoconazole 2 % shampoo Commonly known as:  NIZORAL Apply 1 application topically every Monday.   loratadine 10 MG tablet Commonly known as:  CLARITIN Take 10 mg by mouth daily. May take an additional 10 mg as needed for allergies   LORazepam 0.5 MG tablet Commonly known as:  ATIVAN Take 0.25 mg by mouth daily. May take an additional 0.25 mg twice daily as needed for anxiety   losartan 25 MG tablet Commonly known as:  COZAAR Take 25 mg by mouth daily.   montelukast 10 MG tablet Commonly known as:  SINGULAIR Take 10 mg by mouth at bedtime.   neomycin-polymyxin-dexameth 0.1 % Oint Commonly known as:  MAXITROL Place 1 application into the  left eye at bedtime.   NUTRITIONAL SUPPLEMENT Liqd Take 120 mLs by mouth 3 (three) times daily. Med Pass   ondansetron 4 MG tablet Commonly known as:  ZOFRAN Take 4 mg by mouth 3 (three) times daily as needed for nausea or vomiting.   pantoprazole 40 MG tablet Commonly known as:  PROTONIX Take 40 mg by mouth at bedtime.   sennosides-docusate sodium 8.6-50 MG tablet Commonly known as:  SENOKOT-S Take 1 tablet by mouth daily.   simvastatin 40 MG tablet Commonly known as:  ZOCOR Take 40 mg by mouth at bedtime.   THEREMS Tabs Take 1 tablet  by mouth daily.   traMADol 50 MG tablet Commonly known as:  ULTRAM Take 50 mg by mouth 3 (three) times daily. May take an additional 50 mg twice daily as needed for pain, may be within 1 hour of scheduled dose   VOLTAREN 1 % Gel Generic drug:  diclofenac sodium Apply 2 g topically 2 (two) times daily. To left shoulder and either knee, do not exceed 4 gm / 24 hrs       Contact information for follow-up providers    Earnestine Leys, MD. Go to.   Specialty:  Specialist Contact information: [place] [address] [phone]            Contact information for after-discharge care    Destination    [facility] Follow up.   Specialty:  [place] information: [place] [phone]                  Signed: Park Breed 04/09/2017, 12:56 PM

## 2017-04-09 NOTE — Progress Notes (Signed)
Subjective: 1 Day Post-Op Procedure(s) (LRB): INTRAMEDULLARY (IM) NAIL HUMERAL (Left)    Patient reports pain as mild.  Did well last night.  CSM good left arm.  Minimal swelling.  Dressings changed today.  Plan return to SNF today and RTC 4 days  Objective:   VITALS:   Vitals:   04/09/17 0001 04/09/17 0830  BP: 98/61 114/62  Pulse: 93 78  Resp:  20  Temp:  98 F (36.7 C)    Neurologically intact ABD soft Neurovascular intact Sensation intact distally Intact pulses distally Incision: scant drainage  LABS  Recent Labs  04/08/17 1654 04/09/17 1217  HGB 12.2 11.4*  HCT 36.6 34.2*  WBC 18.4* 11.9*  PLT 250 220     Recent Labs  04/08/17 1654  CREATININE 1.14*     Recent Labs  04/08/17 1026  INR 1.04     Assessment/Plan: 1 Day Post-Op Procedure(s) (LRB): INTRAMEDULLARY (IM) NAIL HUMERAL (Left)   Advance diet Up with therapy D/C IV fluids Discharge to SNF

## 2017-04-09 NOTE — Care Management Obs Status (Signed)
[place] NOTIFICATION   Patient Details  Name: Allison Quinn MRN: [id number] Date of Birth: 04-Jul-1926   Medicare Observation Status Notification Given:  Other (see comment) (Unable to rach emergency contact. No other number availbale. Left Obs letter at bedside)    Jolly Mango, RN 04/09/2017, 9:35 AM

## 2017-04-09 NOTE — Clinical Social Work Note (Signed)
Clinical Social Work Assessment  Patient Details  Name: Allison Quinn MRN: [id number] Date of Birth: 09-27-1926  Date of referral:  04/09/17               Reason for consult:  Other (Comment Required) (From [facility] )                Permission sought to share information with:  Chartered certified accountant granted to share information::  Yes, Verbal Permission Granted  Name::      Allison Quinn::   [place]   Relationship::     Contact Information:     Housing/Transportation Living arrangements for the past 2 months:  [place] of Information:  Patient, Adult Children, Facility Patient Interpreter Needed:  None Criminal Activity/Legal Involvement Pertinent to Current Situation/Hospitalization:  No - Comment as needed Significant Relationships:  Adult Children Lives with:  Facility Resident Do you feel safe going back to the place where you live?  Yes Need for family participation in patient care:  Yes (Comment)  Care giving concerns:  Patient is a long term care resident at [facility].    Social Worker assessment / plan:  Holiday representative ([place]) reviewed chart and noted that patient is from [place]. Per Seth Bake admissions coordinator at [facility] patient is a long term care SNF resident and is bed bound at baseline. Per Seth Bake patient can return to [facility] when medically stable. CSW met with patient and her daughter Allison Quinn [phone] was at bed side. Patient was alert and oriented X4 and was laying in the bed. CSW introduced self and explained role of CSW department. Patient reported that she has lived at [facility] for a long time and plans on returning there. CSW made patient and daughter aware that MD will likely D/C patient today if she is medically stable. Patient and daughter verbalized their understanding. CSW will continue to follow and assist as needed.      Employment  status:  Retired Nurse, adult PT Recommendations:  Not assessed at this time [place] / Referral to community resources:  [place]  Patient/Family's Response to care:  Patient is agreeable to return to [facility].   Patient/Family's Understanding of and Emotional Response to Diagnosis, Current Treatment, and Prognosis:  Patient and her daughter were very pleasant and thanked CSW for visit.   Emotional Assessment Appearance:  Appears stated age Attitude/Demeanor/Rapport:    Affect (typically observed):  Accepting, Adaptable, Pleasant Orientation:  Oriented to Self, Oriented to Place, Oriented to  Time, Oriented to Situation Alcohol / Substance use:  Not Applicable Psych involvement (Current and /or in the community):  No (Comment)  Discharge Needs  Concerns to be addressed:  Discharge Planning Concerns Readmission within the last 30 days:  No Current discharge risk:  Dependent with Mobility Barriers to Discharge:  Continued Medical Work up   [employer], Veronia Beets, LCSW 04/09/2017, 10:41 AM

## 2017-04-10 DIAGNOSIS — S42201A Unspecified fracture of upper end of right humerus, initial encounter for closed fracture: Secondary | ICD-10-CM | POA: Diagnosis not present

## 2017-04-14 ENCOUNTER — Other Ambulatory Visit: Payer: Self-pay | Admitting: Pathology

## 2017-04-14 LAB — SURGICAL PATHOLOGY

## 2017-04-20 ENCOUNTER — Telehealth: Payer: Self-pay

## 2017-04-20 NOTE — Telephone Encounter (Signed)
Patient son Allison Quinn called and wants results of biopsy. And would like to know do they need to come in and discuss results and bring her too or can you talk to them over the phone  [phone]

## 2017-04-21 DIAGNOSIS — F39 Unspecified mood [affective] disorder: Secondary | ICD-10-CM | POA: Diagnosis not present

## 2017-04-21 DIAGNOSIS — C7951 Secondary malignant neoplasm of bone: Secondary | ICD-10-CM | POA: Diagnosis not present

## 2017-04-21 DIAGNOSIS — I480 Paroxysmal atrial fibrillation: Secondary | ICD-10-CM | POA: Diagnosis not present

## 2017-04-21 DIAGNOSIS — C50919 Malignant neoplasm of unspecified site of unspecified female breast: Secondary | ICD-10-CM | POA: Diagnosis not present

## 2017-04-21 DIAGNOSIS — G8191 Hemiplegia, unspecified affecting right dominant side: Secondary | ICD-10-CM | POA: Diagnosis not present

## 2017-04-21 NOTE — Telephone Encounter (Signed)
i'll just call them later this week. Please remind me!  Thanks!

## 2017-04-22 ENCOUNTER — Other Ambulatory Visit: Payer: Self-pay | Admitting: Oncology

## 2017-04-22 MED ORDER — LETROZOLE 2.5 MG PO TABS: 2.5000 mg | ORAL_TABLET | Freq: Every day | ORAL | 11 refills | Status: AC

## 2017-04-22 NOTE — Telephone Encounter (Signed)
Do not forget to call them!!!!

## 2017-05-20 DIAGNOSIS — E785 Hyperlipidemia, unspecified: Secondary | ICD-10-CM | POA: Diagnosis not present

## 2017-05-20 DIAGNOSIS — I480 Paroxysmal atrial fibrillation: Secondary | ICD-10-CM

## 2017-05-20 DIAGNOSIS — I69351 Hemiplegia and hemiparesis following cerebral infarction affecting right dominant side: Secondary | ICD-10-CM | POA: Diagnosis not present

## 2017-05-20 DIAGNOSIS — F419 Anxiety disorder, unspecified: Secondary | ICD-10-CM | POA: Diagnosis not present

## 2017-05-20 DIAGNOSIS — R4020 Unspecified coma: Secondary | ICD-10-CM

## 2017-05-21 DIAGNOSIS — I639 Cerebral infarction, unspecified: Secondary | ICD-10-CM

## 2017-06-24 DIAGNOSIS — F39 Unspecified mood [affective] disorder: Secondary | ICD-10-CM | POA: Diagnosis not present

## 2017-06-24 DIAGNOSIS — I10 Essential (primary) hypertension: Secondary | ICD-10-CM | POA: Diagnosis not present

## 2017-06-24 DIAGNOSIS — I69398 Other sequelae of cerebral infarction: Secondary | ICD-10-CM | POA: Diagnosis not present

## 2017-06-24 DIAGNOSIS — I872 Venous insufficiency (chronic) (peripheral): Secondary | ICD-10-CM | POA: Diagnosis not present

## 2017-06-24 DIAGNOSIS — K219 Gastro-esophageal reflux disease without esophagitis: Secondary | ICD-10-CM

## 2017-06-24 DIAGNOSIS — I4891 Unspecified atrial fibrillation: Secondary | ICD-10-CM | POA: Diagnosis not present

## 2017-06-24 DIAGNOSIS — M1990 Unspecified osteoarthritis, unspecified site: Secondary | ICD-10-CM | POA: Diagnosis not present

## 2017-06-24 DIAGNOSIS — C50919 Malignant neoplasm of unspecified site of unspecified female breast: Secondary | ICD-10-CM | POA: Diagnosis not present

## 2017-07-17 DIAGNOSIS — L600 Ingrowing nail: Secondary | ICD-10-CM | POA: Diagnosis not present

## 2017-07-29 DIAGNOSIS — B351 Tinea unguium: Secondary | ICD-10-CM | POA: Diagnosis not present

## 2017-08-18 DIAGNOSIS — I69351 Hemiplegia and hemiparesis following cerebral infarction affecting right dominant side: Secondary | ICD-10-CM | POA: Diagnosis not present

## 2017-08-18 DIAGNOSIS — G8191 Hemiplegia, unspecified affecting right dominant side: Secondary | ICD-10-CM | POA: Diagnosis not present

## 2017-08-18 DIAGNOSIS — F39 Unspecified mood [affective] disorder: Secondary | ICD-10-CM | POA: Diagnosis not present

## 2017-08-18 DIAGNOSIS — I480 Paroxysmal atrial fibrillation: Secondary | ICD-10-CM | POA: Diagnosis not present

## 2017-08-18 DIAGNOSIS — F419 Anxiety disorder, unspecified: Secondary | ICD-10-CM

## 2017-08-18 DIAGNOSIS — C7981 Secondary malignant neoplasm of breast: Secondary | ICD-10-CM | POA: Diagnosis not present

## 2017-08-18 DIAGNOSIS — F329 Major depressive disorder, single episode, unspecified: Secondary | ICD-10-CM | POA: Diagnosis not present

## 2017-08-27 DIAGNOSIS — I83028 Varicose veins of left lower extremity with ulcer other part of lower leg: Secondary | ICD-10-CM | POA: Diagnosis not present

## 2017-09-17 DIAGNOSIS — L303 Infective dermatitis: Secondary | ICD-10-CM | POA: Diagnosis not present

## 2017-10-15 DIAGNOSIS — L723 Sebaceous cyst: Secondary | ICD-10-CM | POA: Diagnosis not present

## 2017-10-21 DIAGNOSIS — C50919 Malignant neoplasm of unspecified site of unspecified female breast: Secondary | ICD-10-CM | POA: Diagnosis not present

## 2017-10-21 DIAGNOSIS — M1990 Unspecified osteoarthritis, unspecified site: Secondary | ICD-10-CM | POA: Diagnosis not present

## 2017-10-21 DIAGNOSIS — I10 Essential (primary) hypertension: Secondary | ICD-10-CM | POA: Diagnosis not present

## 2017-10-21 DIAGNOSIS — F39 Unspecified mood [affective] disorder: Secondary | ICD-10-CM | POA: Diagnosis not present

## 2017-10-21 DIAGNOSIS — K219 Gastro-esophageal reflux disease without esophagitis: Secondary | ICD-10-CM | POA: Diagnosis not present

## 2017-10-21 DIAGNOSIS — I4891 Unspecified atrial fibrillation: Secondary | ICD-10-CM | POA: Diagnosis not present

## 2017-10-22 ENCOUNTER — Other Ambulatory Visit: Payer: Medicare Other

## 2017-10-22 ENCOUNTER — Ambulatory Visit: Payer: Medicare Other | Admitting: Oncology

## 2017-10-24 ENCOUNTER — Other Ambulatory Visit: Payer: Self-pay | Admitting: Oncology

## 2017-10-24 DIAGNOSIS — C50919 Malignant neoplasm of unspecified site of unspecified female breast: Secondary | ICD-10-CM

## 2017-10-24 DIAGNOSIS — Z170 Estrogen receptor positive status [ER+]: Principal | ICD-10-CM

## 2017-10-28 ENCOUNTER — Ambulatory Visit: Payer: Medicare Other | Admitting: Oncology

## 2017-10-28 ENCOUNTER — Other Ambulatory Visit: Payer: Medicare Other

## 2017-11-25 DIAGNOSIS — F329 Major depressive disorder, single episode, unspecified: Secondary | ICD-10-CM | POA: Diagnosis not present

## 2017-11-25 DIAGNOSIS — I480 Paroxysmal atrial fibrillation: Secondary | ICD-10-CM | POA: Diagnosis not present

## 2017-11-25 DIAGNOSIS — I69351 Hemiplegia and hemiparesis following cerebral infarction affecting right dominant side: Secondary | ICD-10-CM | POA: Diagnosis not present

## 2017-11-25 DIAGNOSIS — F419 Anxiety disorder, unspecified: Secondary | ICD-10-CM | POA: Diagnosis not present

## 2017-12-17 DIAGNOSIS — G8191 Hemiplegia, unspecified affecting right dominant side: Secondary | ICD-10-CM | POA: Diagnosis not present

## 2017-12-17 DIAGNOSIS — K219 Gastro-esophageal reflux disease without esophagitis: Secondary | ICD-10-CM | POA: Diagnosis not present

## 2017-12-17 DIAGNOSIS — C50919 Malignant neoplasm of unspecified site of unspecified female breast: Secondary | ICD-10-CM | POA: Diagnosis not present

## 2017-12-17 DIAGNOSIS — C7951 Secondary malignant neoplasm of bone: Secondary | ICD-10-CM | POA: Diagnosis not present

## 2017-12-17 DIAGNOSIS — I480 Paroxysmal atrial fibrillation: Secondary | ICD-10-CM | POA: Diagnosis not present

## 2018-01-26 DIAGNOSIS — F329 Major depressive disorder, single episode, unspecified: Secondary | ICD-10-CM | POA: Diagnosis not present

## 2018-01-26 DIAGNOSIS — I69351 Hemiplegia and hemiparesis following cerebral infarction affecting right dominant side: Secondary | ICD-10-CM | POA: Diagnosis not present

## 2018-01-26 DIAGNOSIS — F419 Anxiety disorder, unspecified: Secondary | ICD-10-CM | POA: Diagnosis not present

## 2018-01-26 DIAGNOSIS — I480 Paroxysmal atrial fibrillation: Secondary | ICD-10-CM | POA: Diagnosis not present

## 2018-02-19 DIAGNOSIS — K219 Gastro-esophageal reflux disease without esophagitis: Secondary | ICD-10-CM

## 2018-02-19 DIAGNOSIS — M1990 Unspecified osteoarthritis, unspecified site: Secondary | ICD-10-CM | POA: Diagnosis not present

## 2018-02-19 DIAGNOSIS — I69354 Hemiplegia and hemiparesis following cerebral infarction affecting left non-dominant side: Secondary | ICD-10-CM

## 2018-02-19 DIAGNOSIS — I10 Essential (primary) hypertension: Secondary | ICD-10-CM

## 2018-02-19 DIAGNOSIS — F39 Unspecified mood [affective] disorder: Secondary | ICD-10-CM

## 2018-02-19 DIAGNOSIS — I4891 Unspecified atrial fibrillation: Secondary | ICD-10-CM | POA: Diagnosis not present

## 2018-02-19 DIAGNOSIS — C50919 Malignant neoplasm of unspecified site of unspecified female breast: Secondary | ICD-10-CM

## 2018-02-19 DIAGNOSIS — F0150 Vascular dementia without behavioral disturbance: Secondary | ICD-10-CM

## 2018-02-24 DIAGNOSIS — F0150 Vascular dementia without behavioral disturbance: Secondary | ICD-10-CM

## 2018-03-29 DIAGNOSIS — F329 Major depressive disorder, single episode, unspecified: Secondary | ICD-10-CM | POA: Diagnosis not present

## 2018-03-29 DIAGNOSIS — I69351 Hemiplegia and hemiparesis following cerebral infarction affecting right dominant side: Secondary | ICD-10-CM | POA: Diagnosis not present

## 2018-03-29 DIAGNOSIS — F419 Anxiety disorder, unspecified: Secondary | ICD-10-CM | POA: Diagnosis not present

## 2018-03-29 DIAGNOSIS — I480 Paroxysmal atrial fibrillation: Secondary | ICD-10-CM

## 2018-04-07 DIAGNOSIS — S51801A Unspecified open wound of right forearm, initial encounter: Secondary | ICD-10-CM

## 2018-04-15 DIAGNOSIS — L03113 Cellulitis of right upper limb: Secondary | ICD-10-CM | POA: Diagnosis not present

## 2018-04-20 DIAGNOSIS — G8191 Hemiplegia, unspecified affecting right dominant side: Secondary | ICD-10-CM | POA: Diagnosis not present

## 2018-04-20 DIAGNOSIS — F39 Unspecified mood [affective] disorder: Secondary | ICD-10-CM

## 2018-04-20 DIAGNOSIS — I480 Paroxysmal atrial fibrillation: Secondary | ICD-10-CM

## 2018-04-20 DIAGNOSIS — C50919 Malignant neoplasm of unspecified site of unspecified female breast: Secondary | ICD-10-CM

## 2018-04-20 DIAGNOSIS — C7951 Secondary malignant neoplasm of bone: Secondary | ICD-10-CM | POA: Diagnosis not present

## 2018-05-07 DIAGNOSIS — B351 Tinea unguium: Secondary | ICD-10-CM | POA: Diagnosis not present

## 2018-06-23 DIAGNOSIS — K219 Gastro-esophageal reflux disease without esophagitis: Secondary | ICD-10-CM

## 2018-06-23 DIAGNOSIS — I4891 Unspecified atrial fibrillation: Secondary | ICD-10-CM

## 2018-06-23 DIAGNOSIS — C50912 Malignant neoplasm of unspecified site of left female breast: Secondary | ICD-10-CM

## 2018-06-23 DIAGNOSIS — C7951 Secondary malignant neoplasm of bone: Secondary | ICD-10-CM

## 2018-06-23 DIAGNOSIS — I10 Essential (primary) hypertension: Secondary | ICD-10-CM | POA: Diagnosis not present

## 2018-06-23 DIAGNOSIS — I69398 Other sequelae of cerebral infarction: Secondary | ICD-10-CM

## 2018-06-23 DIAGNOSIS — M1990 Unspecified osteoarthritis, unspecified site: Secondary | ICD-10-CM

## 2018-07-06 IMAGING — CT CT ABD-PELV W/ CM
2 of 5 series · 13 of 36 positions shown, 16 images · IV contrast (iopamidol)
Comparison: None

CLINICAL DATA: Metastatic disease.  Staging.

EXAM:
CT CHEST, ABDOMEN, AND PELVIS WITH CONTRAST
TECHNIQUE: Multidetector CT imaging of the chest, abdomen and pelvis was
performed following the standard protocol during bolus
administration of intravenous contrast.
CONTRAST:  75mL [id number] IOPAMIDOL ([id number]) INJECTION 61%

[Series 2: cap with · axial · 0.98mm/px · z∈[-881,-406]mm · 10 of 117 slices shown, 13 images]
[im 11/117  mediastinal]
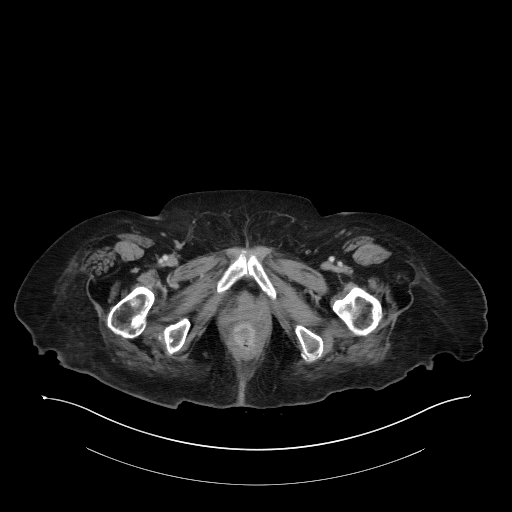
[im 11/117  lung]
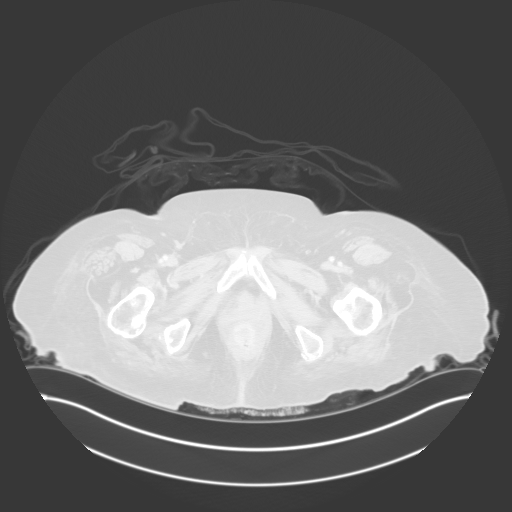
[im 22/117  lung]
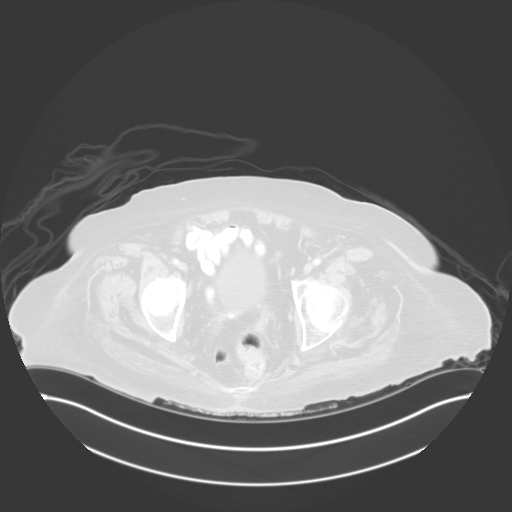
[im 32/117  lung]
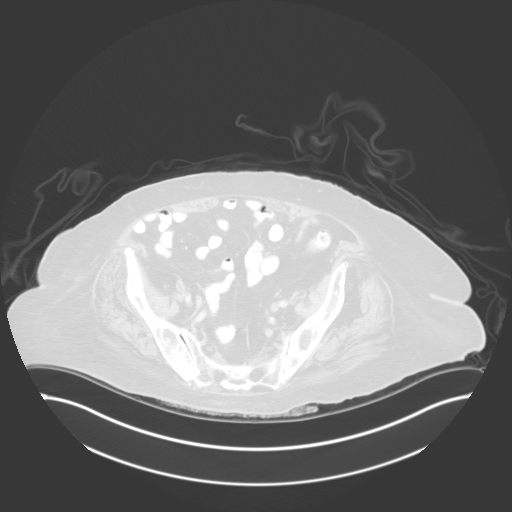
[im 43/117  lung]
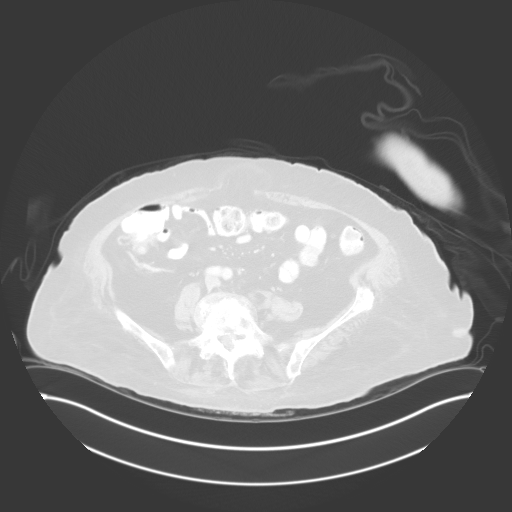
[im 53/117  mediastinal]
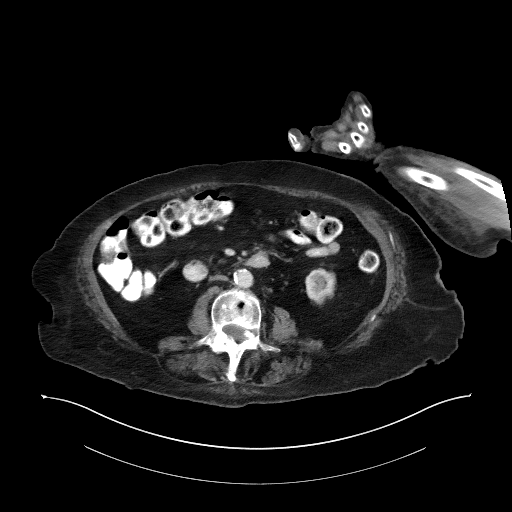
[im 53/117  lung]
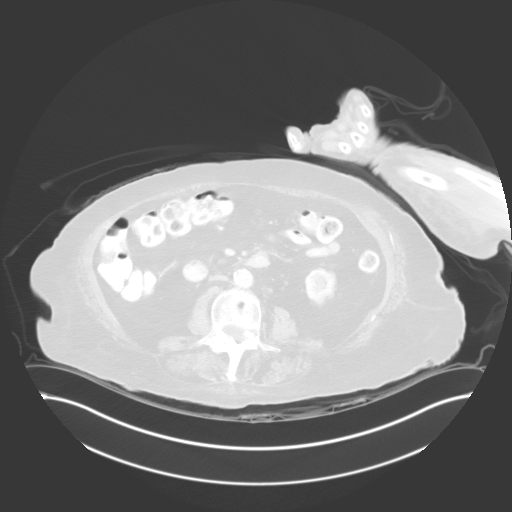
[im 64/117  lung]
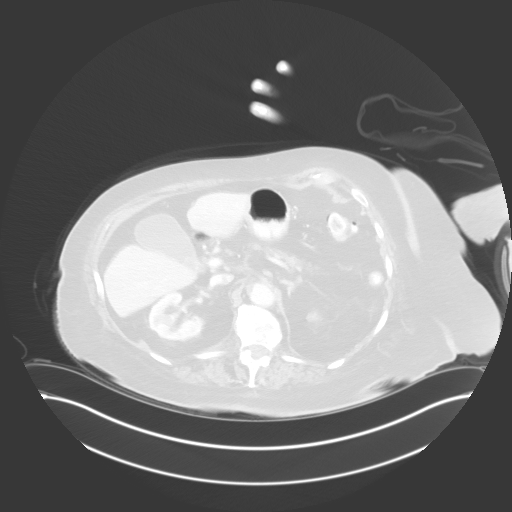
[im 74/117  lung]
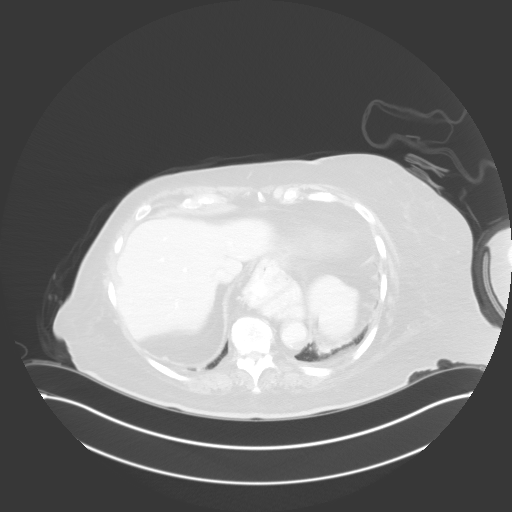
[im 85/117  lung]
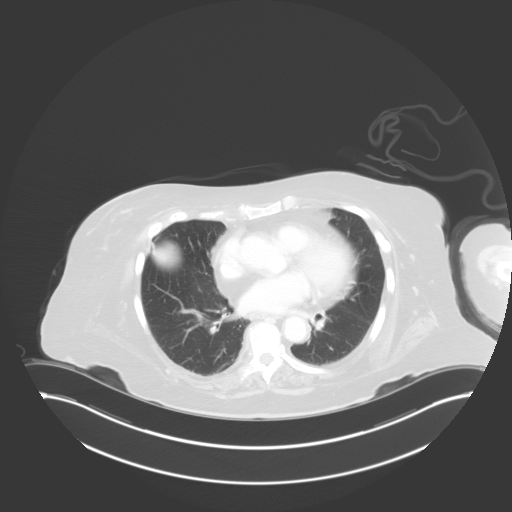
[im 95/117  mediastinal]
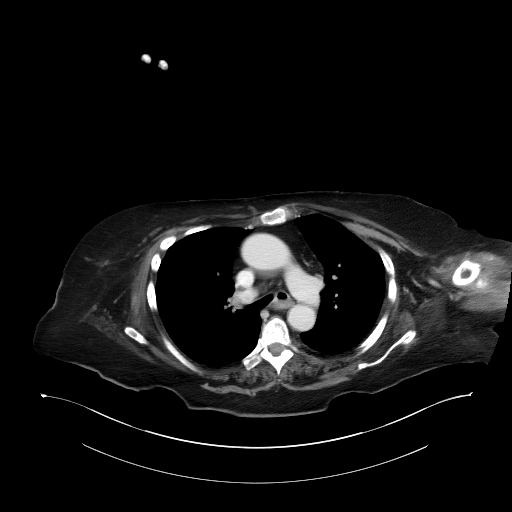
[im 95/117  lung]
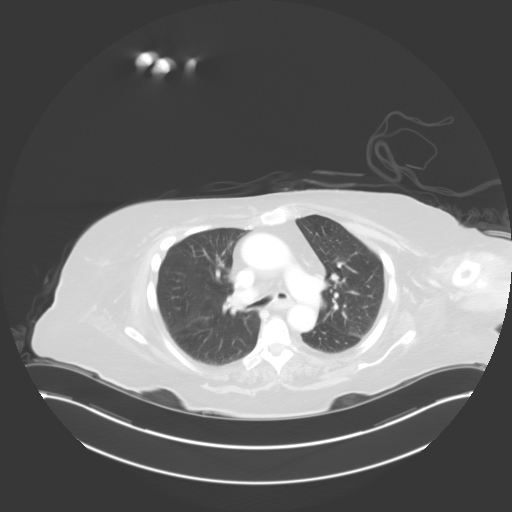
[im 106/117  lung]
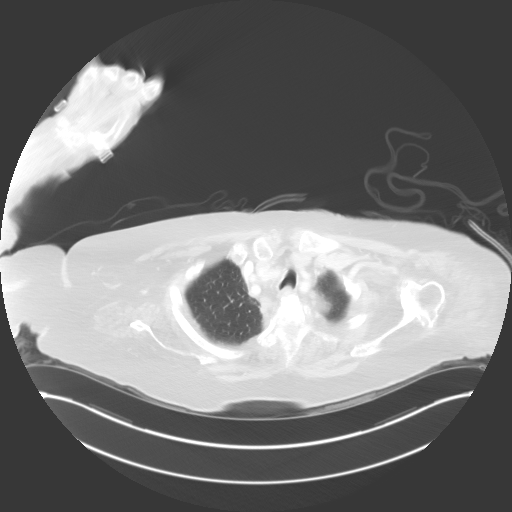

[Series 6: coronals · coronal · 0.85mm/px · 3 of 120 slices shown]
[im 24/120  lung]
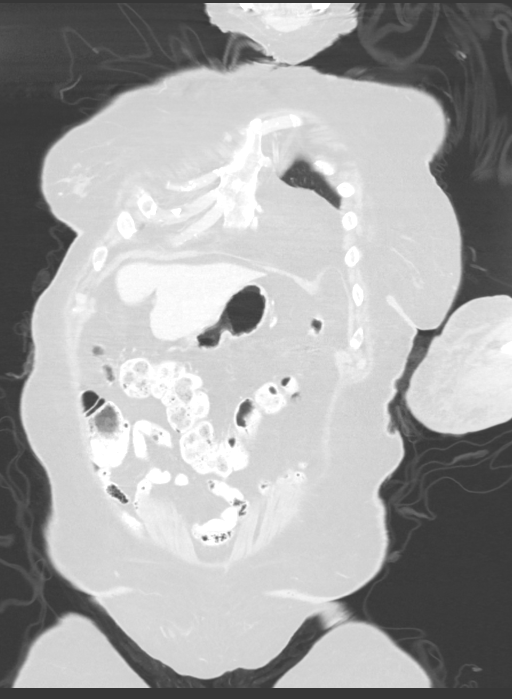
[im 48/120  lung]
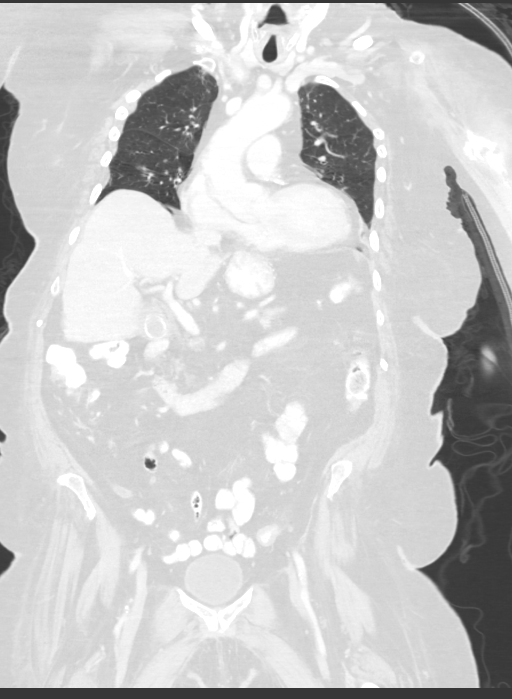
[im 72/120  lung]
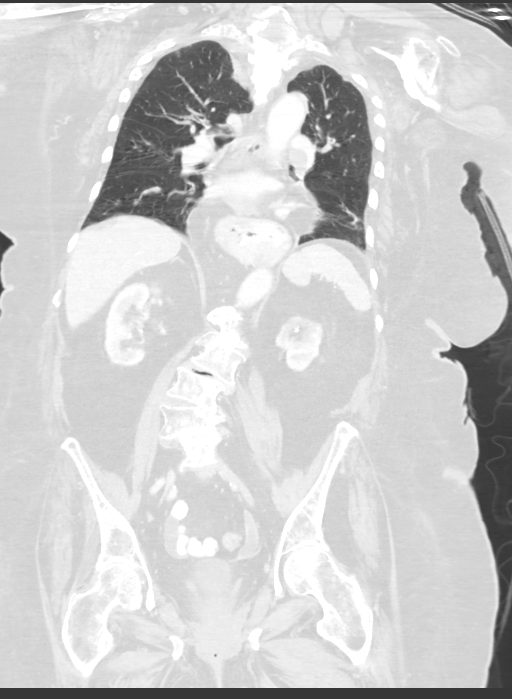

[13 of 36 positions shown; findings below may reference images not displayed]

FINDINGS: CT CHEST FINDINGS

Cardiovascular: Moderate cardiac enlargement. Aortic atherosclerosis
noted. Filling defect identified within the central left lower lobe
pulmonary artery, image 27 of series 2 and right upper lobe
pulmonary artery, image 68 of series 6.

Mediastinum/Nodes: Moderate to large hiatal hernia. The trachea
appears patent and midline. No enlarged mediastinal or hilar
adenopathy. No axillary or supraclavicular adenopathy.

Lungs/Pleura: No pleural effusion. No suspicious pulmonary nodules
or masses.

Musculoskeletal: Mass in the central right breast measures 2.3 cm,
image 28 of series 2. No enlarged right axillary or supraclavicular
lymph nodes. Diffuse mixed lytic and sclerotic bone metastases are
identified throughout the visualized osseous structures compatible
with widespread bone metastases. Compression fracture of T9 is
identified which may be pathologic. Pathologic fracture involving
the left humerus is noted with underlying of permitted bone lesion.
Extensive permeative changes involving the proximal right humerus
also noted which may be of orthopedic significance.

CT ABDOMEN PELVIS FINDINGS

Hepatobiliary: No suspicious liver abnormality. Stone within the
gallbladder measures 1.7 cm.

Pancreas: Diffuse atrophy of the pancreas.  No mass identified.

Spleen: The spleen is unremarkable.

Adrenals/Urinary Tract: Normal adrenal glands. Left renal atrophy.
Multiple areas of cortical scarring are noted involving both
kidneys. Small bilateral renal cysts are less than 1 cm in too small
to reliably characterize. Stone within the upper pole of the left
kidney measures 4 mm. No mass or hydronephrosis. The urinary bladder
appears normal.

Stomach/Bowel: Large hiatal hernia. The small bowel loops have a
normal course and caliber. The appendix is upper limits of normal in
caliber measuring 8 mm without surrounding inflammatory change.
There is no pathologic dilatation of the colon.

Vascular/Lymphatic: Aortic atherosclerosis. No aneurysm. No upper
abdominal adenopathy. No pelvic or inguinal adenopathy identified.

Reproductive: Previous hysterectomy.  No adnexal mass.

Other: There is a large periumbilical hernia which contains fat
only, image 79 of series 2.

Musculoskeletal: Extensive mixed lytic and sclerotic bone lesions
are identified.
IMPRESSION: 1. Widespread osseous metastatic disease with a mixed lytic and
sclerotic pattern. Pathologic fractures involving left humerus and
T9 vertebra noted.
2. There is a solid, enhancing lesion within the right breast which
is suspicious and may reflect primary breast carcinoma.
3. Left lower lobe and right upper lobe pulmonary artery filling
defects compatible with pulmonary embolus.
4. Aortic Atherosclerosis (MYXP1-DCO.O), Gall[place], left renal
calculus, hiatal hernia, and ventral abdominal wall hernia.

Critical Value/emergent results were called by telephone at the time
of interpretation on 03/24/2017 at [DATE] to Dr. TOSHA SG ,
who verbally acknowledged these results.

## 2018-08-12 DIAGNOSIS — I480 Paroxysmal atrial fibrillation: Secondary | ICD-10-CM | POA: Diagnosis not present

## 2018-08-12 DIAGNOSIS — F39 Unspecified mood [affective] disorder: Secondary | ICD-10-CM

## 2018-08-12 DIAGNOSIS — G8191 Hemiplegia, unspecified affecting right dominant side: Secondary | ICD-10-CM

## 2018-08-12 DIAGNOSIS — E440 Moderate protein-calorie malnutrition: Secondary | ICD-10-CM | POA: Diagnosis not present

## 2018-08-12 DIAGNOSIS — Z853 Personal history of malignant neoplasm of breast: Secondary | ICD-10-CM

## 2018-08-12 DIAGNOSIS — C7951 Secondary malignant neoplasm of bone: Secondary | ICD-10-CM | POA: Diagnosis not present

## 2018-08-20 DIAGNOSIS — L989 Disorder of the skin and subcutaneous tissue, unspecified: Secondary | ICD-10-CM | POA: Diagnosis not present

## 2018-08-26 DIAGNOSIS — D170 Benign lipomatous neoplasm of skin and subcutaneous tissue of head, face and neck: Secondary | ICD-10-CM | POA: Diagnosis not present

## 2018-10-22 DIAGNOSIS — C50919 Malignant neoplasm of unspecified site of unspecified female breast: Secondary | ICD-10-CM

## 2018-10-22 DIAGNOSIS — K219 Gastro-esophageal reflux disease without esophagitis: Secondary | ICD-10-CM | POA: Diagnosis not present

## 2018-10-22 DIAGNOSIS — M1990 Unspecified osteoarthritis, unspecified site: Secondary | ICD-10-CM

## 2018-10-22 DIAGNOSIS — I10 Essential (primary) hypertension: Secondary | ICD-10-CM

## 2018-10-22 DIAGNOSIS — F39 Unspecified mood [affective] disorder: Secondary | ICD-10-CM

## 2018-10-22 DIAGNOSIS — E43 Unspecified severe protein-calorie malnutrition: Secondary | ICD-10-CM

## 2018-10-22 DIAGNOSIS — I4891 Unspecified atrial fibrillation: Secondary | ICD-10-CM

## 2018-10-22 DIAGNOSIS — I69351 Hemiplegia and hemiparesis following cerebral infarction affecting right dominant side: Secondary | ICD-10-CM

## 2018-10-27 DIAGNOSIS — R4181 Age-related cognitive decline: Secondary | ICD-10-CM | POA: Diagnosis not present

## 2018-11-10 DEATH — deceased
